# Patient Record
Sex: Female | Born: 1960
Health system: Southern US, Community
[De-identification: ages and names within clinical notes are randomized; demographics above are authoritative.]

## PROBLEM LIST (undated history)

## (undated) DIAGNOSIS — H348122 Central retinal vein occlusion, left eye, stable: Principal | ICD-10-CM

## (undated) DIAGNOSIS — H9190 Unspecified hearing loss, unspecified ear: Secondary | ICD-10-CM

## (undated) DIAGNOSIS — T8859XA Other complications of anesthesia, initial encounter: Secondary | ICD-10-CM

## (undated) DIAGNOSIS — E785 Hyperlipidemia, unspecified: Secondary | ICD-10-CM

## (undated) DIAGNOSIS — H409 Unspecified glaucoma: Secondary | ICD-10-CM

## (undated) DIAGNOSIS — I10 Essential (primary) hypertension: Secondary | ICD-10-CM

## (undated) DIAGNOSIS — J45909 Unspecified asthma, uncomplicated: Secondary | ICD-10-CM

## (undated) DIAGNOSIS — D329 Benign neoplasm of meninges, unspecified: Principal | ICD-10-CM

## (undated) HISTORY — DX: Essential (primary) hypertension: I10

## (undated) HISTORY — PX: MANDIBLE SURGERY: SHX707

## (undated) HISTORY — DX: Unspecified glaucoma: H40.9

## (undated) HISTORY — DX: Benign neoplasm of meninges, unspecified: D32.9

## (undated) HISTORY — DX: Central retinal vein occlusion, left eye, stable: H34.8122

## (undated) HISTORY — DX: Hyperlipidemia, unspecified: E78.5

---

## 1998-03-09 ENCOUNTER — Other Ambulatory Visit: Admission: RE | Admit: 1998-03-09 | Discharge: 1998-03-09 | Payer: Self-pay | Admitting: Obstetrics & Gynecology

## 1998-03-16 ENCOUNTER — Ambulatory Visit (HOSPITAL_COMMUNITY): Admission: RE | Admit: 1998-03-16 | Discharge: 1998-03-16 | Payer: Self-pay | Admitting: Obstetrics & Gynecology

## 1999-03-28 ENCOUNTER — Other Ambulatory Visit: Admission: RE | Admit: 1999-03-28 | Discharge: 1999-03-28 | Payer: Self-pay | Admitting: Obstetrics & Gynecology

## 1999-04-05 ENCOUNTER — Encounter: Payer: Self-pay | Admitting: Obstetrics & Gynecology

## 1999-04-05 ENCOUNTER — Ambulatory Visit (HOSPITAL_COMMUNITY): Admission: RE | Admit: 1999-04-05 | Discharge: 1999-04-05 | Payer: Self-pay | Admitting: Obstetrics & Gynecology

## 1999-04-10 ENCOUNTER — Encounter: Payer: Self-pay | Admitting: Obstetrics & Gynecology

## 1999-04-10 ENCOUNTER — Ambulatory Visit (HOSPITAL_COMMUNITY): Admission: RE | Admit: 1999-04-10 | Discharge: 1999-04-10 | Payer: Self-pay | Admitting: Obstetrics & Gynecology

## 1999-04-17 ENCOUNTER — Ambulatory Visit (HOSPITAL_COMMUNITY): Admission: RE | Admit: 1999-04-17 | Discharge: 1999-04-17 | Payer: Self-pay | Admitting: General Surgery

## 1999-04-17 ENCOUNTER — Encounter (INDEPENDENT_AMBULATORY_CARE_PROVIDER_SITE_OTHER): Payer: Self-pay

## 2000-04-08 ENCOUNTER — Encounter: Payer: Self-pay | Admitting: Obstetrics & Gynecology

## 2000-04-08 ENCOUNTER — Ambulatory Visit (HOSPITAL_COMMUNITY): Admission: RE | Admit: 2000-04-08 | Discharge: 2000-04-08 | Payer: Self-pay | Admitting: Obstetrics & Gynecology

## 2000-04-21 ENCOUNTER — Other Ambulatory Visit: Admission: RE | Admit: 2000-04-21 | Discharge: 2000-04-21 | Payer: Self-pay | Admitting: Obstetrics & Gynecology

## 2001-04-17 ENCOUNTER — Ambulatory Visit (HOSPITAL_COMMUNITY): Admission: RE | Admit: 2001-04-17 | Discharge: 2001-04-17 | Payer: Self-pay | Admitting: Obstetrics & Gynecology

## 2001-04-17 ENCOUNTER — Encounter: Payer: Self-pay | Admitting: Obstetrics & Gynecology

## 2001-04-23 ENCOUNTER — Other Ambulatory Visit: Admission: RE | Admit: 2001-04-23 | Discharge: 2001-04-23 | Payer: Self-pay | Admitting: Obstetrics & Gynecology

## 2002-01-06 ENCOUNTER — Other Ambulatory Visit: Admission: RE | Admit: 2002-01-06 | Discharge: 2002-01-06 | Payer: Self-pay | Admitting: Obstetrics & Gynecology

## 2002-04-19 ENCOUNTER — Ambulatory Visit (HOSPITAL_COMMUNITY): Admission: RE | Admit: 2002-04-19 | Discharge: 2002-04-19 | Payer: Self-pay | Admitting: Obstetrics & Gynecology

## 2002-04-19 ENCOUNTER — Encounter: Payer: Self-pay | Admitting: Obstetrics & Gynecology

## 2002-04-28 ENCOUNTER — Encounter (INDEPENDENT_AMBULATORY_CARE_PROVIDER_SITE_OTHER): Payer: Self-pay | Admitting: Specialist

## 2002-04-28 ENCOUNTER — Ambulatory Visit (HOSPITAL_COMMUNITY): Admission: RE | Admit: 2002-04-28 | Discharge: 2002-04-28 | Payer: Self-pay | Admitting: Obstetrics & Gynecology

## 2003-01-13 ENCOUNTER — Other Ambulatory Visit: Admission: RE | Admit: 2003-01-13 | Discharge: 2003-01-13 | Payer: Self-pay | Admitting: Obstetrics & Gynecology

## 2003-05-06 ENCOUNTER — Ambulatory Visit (HOSPITAL_COMMUNITY): Admission: RE | Admit: 2003-05-06 | Discharge: 2003-05-06 | Payer: Self-pay | Admitting: Obstetrics & Gynecology

## 2003-05-12 ENCOUNTER — Encounter: Admission: RE | Admit: 2003-05-12 | Discharge: 2003-05-12 | Payer: Self-pay | Admitting: Obstetrics & Gynecology

## 2004-02-07 ENCOUNTER — Other Ambulatory Visit: Admission: RE | Admit: 2004-02-07 | Discharge: 2004-02-07 | Payer: Self-pay | Admitting: Obstetrics & Gynecology

## 2004-05-16 ENCOUNTER — Ambulatory Visit (HOSPITAL_COMMUNITY): Admission: RE | Admit: 2004-05-16 | Discharge: 2004-05-16 | Payer: Self-pay | Admitting: Obstetrics & Gynecology

## 2004-11-13 ENCOUNTER — Ambulatory Visit (HOSPITAL_COMMUNITY): Admission: RE | Admit: 2004-11-13 | Discharge: 2004-11-13 | Payer: Self-pay | Admitting: Obstetrics & Gynecology

## 2004-11-13 ENCOUNTER — Encounter (INDEPENDENT_AMBULATORY_CARE_PROVIDER_SITE_OTHER): Payer: Self-pay | Admitting: *Deleted

## 2005-02-14 ENCOUNTER — Other Ambulatory Visit: Admission: RE | Admit: 2005-02-14 | Discharge: 2005-02-14 | Payer: Self-pay | Admitting: Obstetrics & Gynecology

## 2005-05-20 ENCOUNTER — Ambulatory Visit (HOSPITAL_COMMUNITY): Admission: RE | Admit: 2005-05-20 | Discharge: 2005-05-20 | Payer: Self-pay | Admitting: Obstetrics & Gynecology

## 2006-06-04 ENCOUNTER — Ambulatory Visit (HOSPITAL_COMMUNITY): Admission: RE | Admit: 2006-06-04 | Discharge: 2006-06-04 | Payer: Self-pay | Admitting: Obstetrics & Gynecology

## 2006-06-16 ENCOUNTER — Encounter: Admission: RE | Admit: 2006-06-16 | Discharge: 2006-06-16 | Payer: Self-pay | Admitting: Obstetrics & Gynecology

## 2007-07-06 ENCOUNTER — Ambulatory Visit (HOSPITAL_COMMUNITY): Admission: RE | Admit: 2007-07-06 | Discharge: 2007-07-06 | Payer: Self-pay | Admitting: Obstetrics & Gynecology

## 2008-07-07 ENCOUNTER — Ambulatory Visit (HOSPITAL_COMMUNITY): Admission: RE | Admit: 2008-07-07 | Discharge: 2008-07-07 | Payer: Self-pay | Admitting: Obstetrics & Gynecology

## 2008-09-20 ENCOUNTER — Encounter: Admission: RE | Admit: 2008-09-20 | Discharge: 2008-09-20 | Payer: Self-pay | Admitting: Otolaryngology

## 2008-09-21 ENCOUNTER — Encounter: Admission: RE | Admit: 2008-09-21 | Discharge: 2008-09-21 | Payer: Self-pay | Admitting: Otolaryngology

## 2009-07-10 ENCOUNTER — Ambulatory Visit (HOSPITAL_COMMUNITY): Admission: RE | Admit: 2009-07-10 | Discharge: 2009-07-10 | Payer: Self-pay | Admitting: Obstetrics & Gynecology

## 2010-06-03 HISTORY — PX: HEMANGIOMA EXCISION: SHX1734

## 2010-06-21 ENCOUNTER — Other Ambulatory Visit (HOSPITAL_COMMUNITY): Payer: Self-pay | Admitting: Obstetrics & Gynecology

## 2010-06-21 DIAGNOSIS — Z0000 Encounter for general adult medical examination without abnormal findings: Secondary | ICD-10-CM

## 2010-06-24 ENCOUNTER — Encounter: Payer: Self-pay | Admitting: Obstetrics & Gynecology

## 2010-06-24 ENCOUNTER — Encounter: Payer: Self-pay | Admitting: Otolaryngology

## 2010-07-05 ENCOUNTER — Ambulatory Visit (HOSPITAL_COMMUNITY): Admission: RE | Admit: 2010-07-05 | Payer: Self-pay | Source: Home / Self Care | Admitting: Obstetrics & Gynecology

## 2010-07-05 ENCOUNTER — Ambulatory Visit (HOSPITAL_COMMUNITY)
Admission: RE | Admit: 2010-07-05 | Discharge: 2010-07-05 | Disposition: A | Discharge status: Home / Self Care | Payer: BC Managed Care – PPO | Source: Ambulatory Visit | Attending: Obstetrics & Gynecology | Admitting: Obstetrics & Gynecology

## 2010-07-05 DIAGNOSIS — Z1231 Encounter for screening mammogram for malignant neoplasm of breast: Secondary | ICD-10-CM | POA: Insufficient documentation

## 2010-07-05 DIAGNOSIS — Z0000 Encounter for general adult medical examination without abnormal findings: Secondary | ICD-10-CM

## 2010-10-19 NOTE — Op Note (Signed)
Tara Kennedy, Tara Kennedy                        ACCOUNT NO.:  [id number]   MEDICAL RECORD NO.:  [id number]                   PATIENT TYPE:  AMB   LOCATION:  SDC                                  FACILITY:  [facility]   PHYSICIAN:  Freddy Finner, M.D.                DATE OF BIRTH:  August 07, 1960   DATE OF PROCEDURE:  04/28/2002  DATE OF DISCHARGE:                                 OPERATIVE REPORT   PREOPERATIVE DIAGNOSES:  1. Menorrhagia.  2. Endometrial polyp.   POSTOPERATIVE DIAGNOSES:  1. Menorrhagia.  2. Endometrial polyp.   SECONDARY DIAGNOSIS:  Known uterine leiomyoma.   OPERATIVE PROCEDURES:  1. Hysteroscopy.  2. Dilatation and curettage.   ANESTHESIA:  General.   INTRAOPERATIVE COMPLICATIONS:  None.   ESTIMATED INTRAOPERATIVE BLOOD LOSS:  Less than 20 cc.   INTRAOPERATIVE SORBITOL DEFICIT:  100 cc.   INDICATIONS:  The patient is a 50 year old who presented for routine GYN  examination which was remarkable for a history of increasing menorrhagia.  Subsequent sonohysterogram showed an endometrial polyp.  The uterus itself  was slightly enlarged and there were small fibroids.  She is admitted now  for hysteroscopy, D&AC, and removal of polyp.   DESCRIPTION OF PROCEDURE:  She was admitted on the morning of surgery and  brought to the operating room.  She was placed under adequate general  anesthesia and placed in the dorsolithotomy position using the [employer]  system.  Betadine prep of the perineum and vagina was carried out in the  usual fashion.  Sterile drapes were applied.  A bivalve speculum was  introduced and the cervix was visualized and grasped with a sharp single-  tooth tenaculum.  Some difficulty was initially encountered identifying the  internal os.  The uterus sounded to 10 cm.  The os was identified and  carefully dilated to 23 with Pratts.  The hysteroscope could still not be  passed because of the acute angle of the internal os with the endometriosis.  Progressive dilatation was carried out to 27.  The hysteroscope was passed.  The polyp was visualized, photographed, and then removed with gentle sharp  curettage.  Exploration with Garen Grams forceps.  Reinspection after  removal revealed adequate resection of the polyp.  The 12.5 degree ACMI  hysteroscope was used.  Three percent Sorbitol was used as the distending  median.  Deficit as noted above.  On completion of the procedure, the patient was awaken and taken to the  recovery room in good condition.  She was given Darvocet for postoperative  pain.  She is to report to the office in about 10 days for postoperative  follow-up.  She is to call for bleeding, fever, and severe pain.  She is to  avoid vaginal entry for five to seven days.  Freddy Finner, M.D.    WRN/MEDQ  D:  04/28/2002  T:  04/28/2002  Job:  [id number]

## 2010-10-19 NOTE — Op Note (Signed)
[name], Tara Kennedy            ACCOUNT NO.:  [id number]   MEDICAL RECORD NO.:  [id number]          PATIENT TYPE:  AMB   LOCATION:  SDC                           [facility]:  [facility]   PHYSICIAN:  Freddy Finner, M.D.   DATE OF BIRTH:  1960-10-13   DATE OF PROCEDURE:  11/13/2004  DATE OF DISCHARGE:                                 OPERATIVE REPORT   PREOPERATIVE DIAGNOSES:  1.  Menorrhagia.  2.  Endometrial thickening.  3.  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  4.  Intramural fibroids.   POSTOPERATIVE DIAGNOSES:  1.  Fibroids.  2.  Endometrial polyp.  3.  Uterine enlargement.   OPERATIVE PROCEDURE:  Hysterectomy, D&C with resection of polyp, NovaSure  endometrial ablation.   ANESTHESIA:  Managed intravenous sedation and paracervical block.   ESTIMATED INTRAOPERATIVE BLOOD LOSS:  Minimal.   INTRAOPERATIVE COMPLICATIONS:  None.   INTRAOPERATIVE LACTATED RINGERS DEFICIT:  10 cc.   The patient is a 50 year old with very heavy menses who had sonohysterogram  in the office showing endometrial thickening and/or endometrial synechia.  She is now to have intramural leiomyoma. She is now admitted for  hysteroscopy D&C and NovaSure.   She is admitted on the morning of surgery. She was given a perioperative  antibiotic. She was brought to the operating room and placed under adequate  intravenous sedation and placed in the dorsal lithotomy position using the  [employer] stirrup system. Betadine prep was carried out in the usual fashion.  Sterile drapes were applied. Bivalved speculum was introduced into the  vagina and the cervix visualized, grasped on the anterior lip with a single-  toothed tenaculum. Paracervical block was placed using a total of 10 cc of  1% Xylocaine. Injections were made at 4 and 8 o'clock in the vaginal  fornices. Cervix sounded to 4 cm. Uterus sounded to 10 cm. Cervix was  progressively dilated to 23 with [facility] dilators. A 12.5 degree ACMI  hysteroscope was introduced  using lactated ringers as distending medium.  There were clots in the endometrial cavity which eventually cleared. There  was what appeared to be a polyp on the lower uterine segment posteriorly and  to the patient's right. Gentle thorough curettage was carried out and  exploration with polyp forceps for endometrial sampling and resection of the  polyp. This was confirmed by repeat inspection with the hysteroscope. The  NovaSure device was then applied under standard procedure, tested successful  with the carbon dioxide test, and the endometrial ablation was accomplished.  Reinspection revealed adequate endometrial ablation throughout the  endometrial cavity. Photographs were made pre and post NovaSure procedure.  The instruments were removed.   The patient was taken to the recovery room in good condition. She did  receive 30 mg of Toradol IV and 30 mg of Toradol IM on completion of the  NovaSure procedure. She will be given routine outpatient surgical  instructions. She is for followup in the office in approximately two weeks.  She is take Darvocet as needed for postoperative pain unrelieved by the  Toradol or other outpatient medications.       WRN/MEDQ  D:  11/13/2004  T:  11/13/2004  Job:  [id number]

## 2011-05-10 ENCOUNTER — Other Ambulatory Visit: Payer: Self-pay | Admitting: Otolaryngology

## 2011-05-10 DIAGNOSIS — D333 Benign neoplasm of cranial nerves: Secondary | ICD-10-CM

## 2011-05-17 ENCOUNTER — Ambulatory Visit
Admission: RE | Admit: 2011-05-17 | Discharge: 2011-05-17 | Disposition: A | Discharge status: Home / Self Care | Payer: BC Managed Care – PPO | Source: Ambulatory Visit | Attending: Otolaryngology | Admitting: Otolaryngology

## 2011-05-17 DIAGNOSIS — D333 Benign neoplasm of cranial nerves: Secondary | ICD-10-CM

## 2011-05-17 MED ORDER — GADOBENATE DIMEGLUMINE 529 MG/ML IV SOLN
12.0000 mL | Freq: Once | INTRAVENOUS | Status: AC | PRN
  Administered 2011-05-17: 12 mL via INTRAVENOUS

## 2011-07-22 ENCOUNTER — Other Ambulatory Visit (HOSPITAL_COMMUNITY): Payer: Self-pay | Admitting: Obstetrics & Gynecology

## 2011-07-22 DIAGNOSIS — Z1231 Encounter for screening mammogram for malignant neoplasm of breast: Secondary | ICD-10-CM

## 2011-08-15 ENCOUNTER — Ambulatory Visit (HOSPITAL_COMMUNITY)
Admission: RE | Admit: 2011-08-15 | Discharge: 2011-08-15 | Disposition: A | Discharge status: Home / Self Care | Payer: BC Managed Care – PPO | Source: Ambulatory Visit | Attending: Obstetrics & Gynecology | Admitting: Obstetrics & Gynecology

## 2011-08-15 DIAGNOSIS — Z1231 Encounter for screening mammogram for malignant neoplasm of breast: Secondary | ICD-10-CM

## 2011-11-20 ENCOUNTER — Other Ambulatory Visit: Payer: Self-pay | Admitting: *Deleted

## 2011-11-20 DIAGNOSIS — D329 Benign neoplasm of meninges, unspecified: Secondary | ICD-10-CM

## 2011-12-10 ENCOUNTER — Ambulatory Visit
Admission: RE | Admit: 2011-12-10 | Discharge: 2011-12-10 | Disposition: A | Discharge status: Home / Self Care | Payer: BC Managed Care – PPO | Source: Ambulatory Visit | Attending: Radiology | Admitting: Radiology

## 2011-12-10 DIAGNOSIS — D329 Benign neoplasm of meninges, unspecified: Secondary | ICD-10-CM

## 2011-12-10 MED ORDER — GADOBENATE DIMEGLUMINE 529 MG/ML IV SOLN
12.0000 mL | Freq: Once | INTRAVENOUS | Status: AC | PRN
  Administered 2011-12-10: 12 mL via INTRAVENOUS

## 2012-01-01 DIAGNOSIS — G245 Blepharospasm: Secondary | ICD-10-CM | POA: Insufficient documentation

## 2012-01-01 DIAGNOSIS — G510 Bell's palsy: Secondary | ICD-10-CM | POA: Insufficient documentation

## 2012-02-13 ENCOUNTER — Telehealth: Payer: Self-pay | Admitting: Oncology

## 2012-02-13 NOTE — Telephone Encounter (Signed)
S/W pt in re NP appt 10/2 @ 1:30 w/Dr. Cyndie Chime Referring Dr. Barbaraann Barthel Dx-MTHFR mutation NP packet mailed.

## 2012-02-14 ENCOUNTER — Telehealth: Payer: Self-pay | Admitting: Oncology

## 2012-02-14 NOTE — Telephone Encounter (Signed)
C/D on 9/13 for appt 10/02

## 2012-02-20 ENCOUNTER — Encounter: Payer: Self-pay | Admitting: Oncology

## 2012-02-20 ENCOUNTER — Other Ambulatory Visit: Payer: Self-pay | Admitting: Oncology

## 2012-02-20 DIAGNOSIS — H348122 Central retinal vein occlusion, left eye, stable: Secondary | ICD-10-CM

## 2012-02-20 HISTORY — DX: Central retinal vein occlusion, left eye, stable: H34.8122

## 2012-02-21 ENCOUNTER — Telehealth: Payer: Self-pay | Admitting: Oncology

## 2012-02-21 NOTE — Telephone Encounter (Signed)
Per 9/19 pof have pt come in for NP lb on 9/30. Moved FC/lb appts to 9/30 and NP appt is now 10/2 @ 2pm. lmonvm for pt but then called her cell and s/w her re changes.

## 2012-02-27 ENCOUNTER — Telehealth: Payer: Self-pay | Admitting: Oncology

## 2012-02-27 NOTE — Telephone Encounter (Signed)
Returned pt's call re wanting to move 9/30 NP lab to 10/1. lmonvm for pt to call me back re keeping 9/30 appt as I am unsure if we will have all lbs back for 10/2 NP appt if we move the lb to 10/1. Tiffany aware.

## 2012-03-02 ENCOUNTER — Ambulatory Visit: Payer: BC Managed Care – PPO

## 2012-03-02 ENCOUNTER — Other Ambulatory Visit: Payer: BC Managed Care – PPO | Admitting: Lab

## 2012-03-03 ENCOUNTER — Other Ambulatory Visit (HOSPITAL_BASED_OUTPATIENT_CLINIC_OR_DEPARTMENT_OTHER): Payer: BC Managed Care – PPO | Admitting: Lab

## 2012-03-03 DIAGNOSIS — H349 Unspecified retinal vascular occlusion: Secondary | ICD-10-CM

## 2012-03-03 DIAGNOSIS — H348122 Central retinal vein occlusion, left eye, stable: Secondary | ICD-10-CM

## 2012-03-03 LAB — CBC & DIFF AND RETIC
BASO%: 0.4 % (ref 0.0–2.0)
Basophils Absolute: 0 10*3/uL (ref 0.0–0.1)
EOS%: 1.1 % (ref 0.0–7.0)
Eosinophils Absolute: 0.1 10*3/uL (ref 0.0–0.5)
HCT: 40.7 % (ref 34.8–46.6)
HGB: 13.5 g/dL (ref 11.6–15.9)
Immature Retic Fract: 2.4 % (ref 1.60–10.00)
LYMPH%: 8.4 % — ABNORMAL LOW (ref 14.0–49.7)
MCH: 30.1 pg (ref 25.1–34.0)
MCHC: 33.2 g/dL (ref 31.5–36.0)
MCV: 90.8 fL (ref 79.5–101.0)
MONO#: 0.9 10*3/uL (ref 0.1–0.9)
MONO%: 11.1 % (ref 0.0–14.0)
NEUT#: 6.6 10*3/uL — ABNORMAL HIGH (ref 1.5–6.5)
NEUT%: 79 % — ABNORMAL HIGH (ref 38.4–76.8)
Platelets: 212 10*3/uL (ref 145–400)
RBC: 4.48 10*6/uL (ref 3.70–5.45)
RDW: 14.4 % (ref 11.2–14.5)
Retic %: 1.58 % (ref 0.70–2.10)
Retic Ct Abs: 70.78 10*3/uL (ref 33.70–90.70)
WBC: 8.4 10*3/uL (ref 3.9–10.3)
lymph#: 0.7 10*3/uL — ABNORMAL LOW (ref 0.9–3.3)

## 2012-03-03 LAB — CHCC SMEAR

## 2012-03-03 LAB — MORPHOLOGY: PLT EST: ADEQUATE

## 2012-03-04 ENCOUNTER — Ambulatory Visit: Payer: BC Managed Care – PPO

## 2012-03-04 ENCOUNTER — Ambulatory Visit (HOSPITAL_BASED_OUTPATIENT_CLINIC_OR_DEPARTMENT_OTHER): Payer: BC Managed Care – PPO | Admitting: Oncology

## 2012-03-04 ENCOUNTER — Encounter: Payer: Self-pay | Admitting: Oncology

## 2012-03-04 ENCOUNTER — Other Ambulatory Visit: Payer: BC Managed Care – PPO | Admitting: Lab

## 2012-03-04 VITALS — BP 122/79 | HR 73 | Temp 97.1°F | Resp 20 | Ht 65.0 in | Wt 141.0 lb

## 2012-03-04 DIAGNOSIS — H349 Unspecified retinal vascular occlusion: Secondary | ICD-10-CM

## 2012-03-04 DIAGNOSIS — D329 Benign neoplasm of meninges, unspecified: Secondary | ICD-10-CM

## 2012-03-04 DIAGNOSIS — I10 Essential (primary) hypertension: Secondary | ICD-10-CM

## 2012-03-04 DIAGNOSIS — E785 Hyperlipidemia, unspecified: Secondary | ICD-10-CM

## 2012-03-04 DIAGNOSIS — Z832 Family history of diseases of the blood and blood-forming organs and certain disorders involving the immune mechanism: Secondary | ICD-10-CM

## 2012-03-04 DIAGNOSIS — Z8249 Family history of ischemic heart disease and other diseases of the circulatory system: Secondary | ICD-10-CM

## 2012-03-04 DIAGNOSIS — H409 Unspecified glaucoma: Secondary | ICD-10-CM

## 2012-03-04 HISTORY — DX: Hyperlipidemia, unspecified: E78.5

## 2012-03-04 HISTORY — DX: Unspecified glaucoma: H40.9

## 2012-03-04 HISTORY — DX: Essential (primary) hypertension: I10

## 2012-03-04 HISTORY — DX: Benign neoplasm of meninges, unspecified: D32.9

## 2012-03-04 NOTE — Progress Notes (Signed)
New Patient Hematology-Oncology Evaluation   Tara Kennedy [id number] 1960/07/22 51 y.o. 03/04/2012  CC: Dr. Beverley Fiedler; Dr. Lottie Dawson ophthalmology; Dr. Elvina Sidle - neurosurgery at [facility]; Dr. Amada Jupiter - radiation oncology at [facility]   Reason for referral:  Personal History of left branch retinal vein occlusion with strong family history of clotting   HPI:  New patient evaluation for this 50 year old woman originally from [place] who developed progressive right facial swelling back in 2010. She was initially referred to Dr. Lovey Newcomer, ear, nose and throat surgeon, and it was felt that this was a sinus problem. He took one look at her and sent her for a CT scan of the brain which showed a meningioma. She was evaluated by neurosurgery at [facility]. After an initial period of observation, tumor began to grow rapidly and she required a major surgical procedure on 12/31/2008. Tumor had infiltrated into local tissues. Due to residual disease she was referred for radiation therapy given in November and December of 2010. Sometime after that, she was evaluated by a retinal specialist Dr. Freddi Starr at [facility] and told that she had a branch retinal vein occlusion. There was increased intraocular pressure consistent with glaucoma. She has used atenolol and Xalatan eyedrops since that time. Additional risk factors for retinal vein occlusion include hypertension and hyperlipidemia. In addition to the above, she tells that she has a pituitary tumor and some small thyroid nodules. (MEN syndrome?)  There is a strong family history of early cardiac disease and clotting problems. Her sister who is currently age 24 and recently diagnosed with breast cancer which has progressed to bone, had a DVT in her arm following hand surgery. Hypercoagulation evaluation revealed she had a positive lupus-type anticoagulant, and she tested positive for the MTHFR gene polymorphism. She was negative for the factor V Leiden gene  mutation and the prothrombin gene mutation.  A brother now 77 years old has a history of thrombosis details not known to the patient. He already has a pacemaker and a implanted defibrillator. Another brother age 16 has had an MI and also has an implanted defibrillator. One brother age 62 and a sister 46 without clotting problems. Her father died at age 25 of an MI. A paternal grandfather died at age 82 of an MI. Her mother died 2 years ago at age 76 of complications of stomach cancer. She was on Coumadin for unknown reasons. She has 4 children, 3 boys age 57, 32, and 34 who are healthy and a 80 year old daughter who was found to have a pituitary tumor.  Subsequent to the 2010 surgery she had to have an additional reconstructive surgery procedure on her right jaw due to severe trismus. No perioperative complications. She had a bunionectomy with no problems.  She is a nonsmoker. No diabetes. She is not obese. She does take Lipitor and Pravachol for hyperlipidemia.   PMH: Past Medical History  Diagnosis Date  . Retinal vein thrombosis, left 02/20/2012  . Meningioma 03/04/2012    Dx 7/10; major resection at [facility], Dr Elvina Sidle then RT  Dr Amada Jupiter  . Glaucoma 03/04/2012  . HTN (hypertension), benign 03/04/2012  . Hyperlipidemia 03/04/2012   she has asthma.   past surgical history:  See above  Allergies: No Known Allergies to medications. She is allergic to cats.  Medications: Lipitor 20 mg daily, Prinivil 10 mg daily, Pravachol 80 mg daily, timolol drops 0.5% one drop both eyes daily, Xalatan 0.005% drops one drop left eye at bedtime  Social History:  Married. 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 Nonsmoker. She does drink wine occasionally. 4 children. 37 year old daughter found to have a pituitary tumor/prolactinoma?   Family History: See above  Review of Systems: Constitutional symptoms: No constitutional symptoms HEENT: No sore throat Respiratory: No  cough or dyspnea Cardiovascular:  No chest pain or palpitations Gastrointestinal ROS: No change in bowel habit Genito-Urinary ROS: Previous hysterectomy without oophorectomy. Previous endometrial ablation procedure prior to the hysterectomy to control menorrhagia Hematological and Lymphatic: Musculoskeletal: No muscle or bone pain. Neurologic: No headache. No blurred vision. Dermatologic: No rash Remaining ROS negative.  Physical Exam: Blood pressure 122/79, pulse 73, temperature 97.1 F (36.2 C), resp. rate 20, height 5\' 5"  (1.651 m), weight 141 lb (63.957 kg). Wt Readings from Last 3 Encounters:  03/04/12 141 lb (63.957 kg)    General appearance: Thin Caucasian woman Head: Right Facial dystocia status post extensive surgery; she still has some trismus I cannot open her mouth completely. Neck: Full range of motion Lymph nodes: No adenopathy Breasts: Not examined Lungs: Clear to auscultation resonant to percussion Heart: Regular rhythm no murmur Abdominal: Soft, nontender, no mass, no organomegaly GU: Not examined Extremities: No edema, no calf tenderness Neurologic: Mental status intact, cranial nerves grossly normal, PERRLA, increased optic cup to disc ratio bilaterally, retinal vessels appear grossly normal with simple ophthalmoscope exam, motor strength is 5 over 5, reflexes 1+ symmetric, sensation intact to vibration over the fingertips by tuning fork exam Skin: No rash or ecchymosis    Lab Results: Lab Results  Component Value Date   WBC 8.4 03/03/2012   HGB 13.5 03/03/2012   HCT 40.7 03/03/2012   MCV 90.8 03/03/2012   PLT 212 03/03/2012     Chemistry   No results found for this basename: NA, K, CL, CO2, BUN, CREATININE, GLU   No results found for this basename: CALCIUM, ALKPHOS, AST, ALT, BILITOT    Lupus-type anticoagulant is not detected. Anticardiolipin antibodies and antibodies to beta 2 glycoprotein 1 are pending   Review of peripheral blood film:  Normal red  cells, white cells, and platelets   Radiological Studies: A recent MRI of the brain done at [facility] on 12/10/2011 shows persistent, extensive, infiltrative tumor involving the right skull base, infra-temporal fossa, right masticator space, right clivus, cavernous sinus, and right sella but unchanged compared with a 05/17/2011 study. Major. intracranial vascular flow stable. No major vessel thrombosis or cavernous sinus thrombosis is noted    Impression and Plan: 51 year old woman  incidentally found to have a left branch retinal vein occlusion following extensive right intracranial surgery and radiation for an aggressive meningioma. She has glaucoma and it is not clear whether this antedated the surgery or not but it is certainly a major risk factor for retinal vein occlusion in addition to other risk factors including her previously untreated hypertension. It is likely that the major intracranial surgery was related to the branch retinal vein occlusion.  Despite her family history, there is no clinical or laboratory suspicion that she has a congenital coagulopathy. With respect to the MTHFR. gene polymorphism, we have learned over the years that this is a surrogate marker for early thrombosis particularly on the arterial side but even when elevated plasma homocystine levels are returned to normal with B complex vitamins, the risk does not go down. Overall, polymorphisms  in this gene is only a minor risk factor for thrombosis and we don't recommend even screening for them anymore as  part of a general hypercoagulation evaluation. Therefore, I did not check her MHTFR. status. She has enough reasons to explain why she had a retinal vein occlusion .  I have not scheduled a followup visit at this time.       Levert Feinstein, MD 03/04/2012, 5:20 PM

## 2012-03-05 LAB — BETA-2 GLYCOPROTEIN ANTIBODIES
Beta-2 Glyco I IgG: 0 G Units (ref <20)
Beta-2-Glycoprotein I IgA: 5 A Units (ref <20)
Beta-2-Glycoprotein I IgM: 18 M Units (ref <20)

## 2012-03-05 LAB — LUPUS ANTICOAGULANT PANEL
DRVVT: 36.1 secs (ref <42.9)
Lupus Anticoagulant: NOT DETECTED
PTT Lupus Anticoagulant: 29.8 secs (ref 28.0–43.0)

## 2012-03-05 LAB — CARDIOLIPIN ANTIBODIES, IGG, IGM, IGA
Anticardiolipin IgA: 7 APL U/mL (ref <22)
Anticardiolipin IgG: 10 GPL U/mL (ref <23)
Anticardiolipin IgM: 5 MPL U/mL (ref <11)

## 2012-03-05 LAB — IMMUNOFIXATION ELECTROPHORESIS
IgA: 357 mg/dL (ref 69–380)
IgG (Immunoglobin G), Serum: 1220 mg/dL (ref 690–1700)
IgM, Serum: 161 mg/dL (ref 52–322)
Total Protein, Serum Electrophoresis: 6.8 g/dL (ref 6.0–8.3)

## 2012-03-05 LAB — SEDIMENTATION RATE: Sed Rate: 1 mm/hr (ref 0–22)

## 2012-03-06 ENCOUNTER — Telehealth: Payer: Self-pay | Admitting: *Deleted

## 2012-03-06 NOTE — Telephone Encounter (Signed)
Called patient and let her know there was no elevation of antiphospholipid antibodies.  Will route labs to [employer] copied for MR to fax to Dr. Barbaraann Barthel,  Unable to route in [place]

## 2012-03-06 NOTE — Telephone Encounter (Addendum)
Message copied by Orbie Hurst on Fri Mar 06, 2012  3:31 PM ------      Message from: Levert Feinstein      Created: Wed Mar 04, 2012  6:35 PM       Call pt:  No elevation of antiphospholipid antibodies; please route labs including negative lupus anticoagulant to Dr Barton [facility] copied for MR to fax to Dr. Barbaraann Barthel.  Unable to route in epic.

## 2012-03-06 NOTE — Telephone Encounter (Signed)
Fax lab to Dr. Barbaraann Barthel office for [employer]

## 2012-03-25 ENCOUNTER — Other Ambulatory Visit: Payer: Self-pay | Admitting: Dermatology

## 2012-07-15 ENCOUNTER — Other Ambulatory Visit (HOSPITAL_COMMUNITY): Payer: Self-pay | Admitting: Obstetrics & Gynecology

## 2012-07-15 DIAGNOSIS — Z1231 Encounter for screening mammogram for malignant neoplasm of breast: Secondary | ICD-10-CM

## 2012-08-17 ENCOUNTER — Ambulatory Visit (HOSPITAL_COMMUNITY)
Admission: RE | Admit: 2012-08-17 | Discharge: 2012-08-17 | Disposition: A | Discharge status: Home / Self Care | Payer: BC Managed Care – PPO | Source: Ambulatory Visit | Attending: Obstetrics & Gynecology | Admitting: Obstetrics & Gynecology

## 2012-08-17 DIAGNOSIS — Z1231 Encounter for screening mammogram for malignant neoplasm of breast: Secondary | ICD-10-CM

## 2012-09-30 ENCOUNTER — Other Ambulatory Visit: Payer: Self-pay | Admitting: *Deleted

## 2012-09-30 DIAGNOSIS — D320 Benign neoplasm of cerebral meninges: Secondary | ICD-10-CM

## 2012-12-01 ENCOUNTER — Ambulatory Visit
Admission: RE | Admit: 2012-12-01 | Discharge: 2012-12-01 | Disposition: A | Discharge status: Home / Self Care | Payer: BC Managed Care – PPO | Source: Ambulatory Visit | Attending: *Deleted | Admitting: *Deleted

## 2012-12-01 DIAGNOSIS — D320 Benign neoplasm of cerebral meninges: Secondary | ICD-10-CM

## 2012-12-01 MED ORDER — GADOBENATE DIMEGLUMINE 529 MG/ML IV SOLN
12.0000 mL | Freq: Once | INTRAVENOUS | Status: AC | PRN
  Administered 2012-12-01: 12 mL via INTRAVENOUS

## 2013-02-26 DIAGNOSIS — H401131 Primary open-angle glaucoma, bilateral, mild stage: Secondary | ICD-10-CM | POA: Insufficient documentation

## 2013-03-31 ENCOUNTER — Other Ambulatory Visit: Payer: Self-pay | Admitting: Dermatology

## 2013-07-19 ENCOUNTER — Other Ambulatory Visit (HOSPITAL_COMMUNITY): Payer: Self-pay | Admitting: Obstetrics & Gynecology

## 2013-07-19 DIAGNOSIS — Z1231 Encounter for screening mammogram for malignant neoplasm of breast: Secondary | ICD-10-CM

## 2013-08-18 ENCOUNTER — Ambulatory Visit (HOSPITAL_COMMUNITY)
Admission: RE | Admit: 2013-08-18 | Discharge: 2013-08-18 | Disposition: A | Discharge status: Home / Self Care | Payer: BC Managed Care – HMO | Source: Ambulatory Visit | Attending: Obstetrics & Gynecology | Admitting: Obstetrics & Gynecology

## 2013-08-18 DIAGNOSIS — Z1231 Encounter for screening mammogram for malignant neoplasm of breast: Secondary | ICD-10-CM

## 2013-09-28 ENCOUNTER — Other Ambulatory Visit: Payer: Self-pay | Admitting: Radiology

## 2013-09-28 DIAGNOSIS — D320 Benign neoplasm of cerebral meninges: Secondary | ICD-10-CM

## 2013-11-15 ENCOUNTER — Other Ambulatory Visit: Payer: BC Managed Care – PPO

## 2013-11-15 ENCOUNTER — Ambulatory Visit
Admission: RE | Admit: 2013-11-15 | Discharge: 2013-11-15 | Disposition: A | Discharge status: Home / Self Care | Payer: BC Managed Care – PPO | Source: Ambulatory Visit | Attending: Radiology | Admitting: Radiology

## 2013-11-15 DIAGNOSIS — D320 Benign neoplasm of cerebral meninges: Secondary | ICD-10-CM

## 2013-11-15 MED ORDER — GADOBENATE DIMEGLUMINE 529 MG/ML IV SOLN
12.0000 mL | Freq: Once | INTRAVENOUS | Status: AC | PRN
  Administered 2013-11-15: 12 mL via INTRAVENOUS

## 2014-02-28 ENCOUNTER — Other Ambulatory Visit: Payer: Self-pay | Admitting: Obstetrics & Gynecology

## 2014-03-01 LAB — CYTOLOGY - PAP

## 2014-08-23 ENCOUNTER — Other Ambulatory Visit (HOSPITAL_COMMUNITY): Payer: Self-pay | Admitting: Obstetrics & Gynecology

## 2014-08-23 DIAGNOSIS — Z1231 Encounter for screening mammogram for malignant neoplasm of breast: Secondary | ICD-10-CM

## 2014-11-07 ENCOUNTER — Ambulatory Visit (HOSPITAL_COMMUNITY)
Admission: RE | Admit: 2014-11-07 | Discharge: 2014-11-07 | Disposition: A | Discharge status: Home / Self Care | Payer: BLUE CROSS/BLUE SHIELD | Source: Ambulatory Visit | Attending: Obstetrics & Gynecology | Admitting: Obstetrics & Gynecology

## 2014-11-07 ENCOUNTER — Other Ambulatory Visit: Payer: Self-pay | Admitting: Radiology

## 2014-11-07 DIAGNOSIS — Z1231 Encounter for screening mammogram for malignant neoplasm of breast: Secondary | ICD-10-CM

## 2014-11-07 DIAGNOSIS — D329 Benign neoplasm of meninges, unspecified: Secondary | ICD-10-CM

## 2014-11-29 ENCOUNTER — Ambulatory Visit
Admission: RE | Admit: 2014-11-29 | Discharge: 2014-11-29 | Disposition: A | Discharge status: Home / Self Care | Payer: BLUE CROSS/BLUE SHIELD | Source: Ambulatory Visit | Attending: Radiology | Admitting: Radiology

## 2014-11-29 DIAGNOSIS — D329 Benign neoplasm of meninges, unspecified: Secondary | ICD-10-CM

## 2014-11-29 MED ORDER — GADOBENATE DIMEGLUMINE 529 MG/ML IV SOLN
12.0000 mL | Freq: Once | INTRAVENOUS | Status: AC | PRN
  Administered 2014-11-29: 12 mL via INTRAVENOUS

## 2014-12-09 DIAGNOSIS — D320 Benign neoplasm of cerebral meninges: Secondary | ICD-10-CM | POA: Insufficient documentation

## 2015-03-06 ENCOUNTER — Other Ambulatory Visit: Payer: Self-pay | Admitting: Obstetrics & Gynecology

## 2015-03-07 LAB — CYTOLOGY - PAP

## 2015-03-15 ENCOUNTER — Telehealth: Payer: Self-pay | Admitting: Oncology

## 2015-03-15 NOTE — Telephone Encounter (Signed)
Completed records request on 03/15/15. Placed with Ms Darryll Capers for patient pick up. Lt mess for pt regarding paperwork complete and ready for pick up

## 2015-04-25 DIAGNOSIS — H35033 Hypertensive retinopathy, bilateral: Secondary | ICD-10-CM | POA: Insufficient documentation

## 2015-04-25 DIAGNOSIS — H2513 Age-related nuclear cataract, bilateral: Secondary | ICD-10-CM | POA: Insufficient documentation

## 2015-05-01 ENCOUNTER — Other Ambulatory Visit: Payer: Self-pay | Admitting: Ophthalmology

## 2015-05-01 DIAGNOSIS — I6521 Occlusion and stenosis of right carotid artery: Secondary | ICD-10-CM

## 2015-05-10 ENCOUNTER — Ambulatory Visit
Admission: RE | Admit: 2015-05-10 | Discharge: 2015-05-10 | Disposition: A | Discharge status: Home / Self Care | Payer: BLUE CROSS/BLUE SHIELD | Source: Ambulatory Visit | Attending: Ophthalmology | Admitting: Ophthalmology

## 2015-05-10 DIAGNOSIS — I6521 Occlusion and stenosis of right carotid artery: Secondary | ICD-10-CM

## 2015-09-05 DIAGNOSIS — H401131 Primary open-angle glaucoma, bilateral, mild stage: Secondary | ICD-10-CM | POA: Diagnosis not present

## 2015-09-05 DIAGNOSIS — H2513 Age-related nuclear cataract, bilateral: Secondary | ICD-10-CM | POA: Diagnosis not present

## 2015-09-05 DIAGNOSIS — H35033 Hypertensive retinopathy, bilateral: Secondary | ICD-10-CM | POA: Diagnosis not present

## 2015-09-29 DIAGNOSIS — H401131 Primary open-angle glaucoma, bilateral, mild stage: Secondary | ICD-10-CM | POA: Diagnosis not present

## 2015-10-10 ENCOUNTER — Other Ambulatory Visit: Payer: Self-pay

## 2015-10-10 DIAGNOSIS — Z1231 Encounter for screening mammogram for malignant neoplasm of breast: Secondary | ICD-10-CM

## 2015-10-26 ENCOUNTER — Other Ambulatory Visit: Payer: Self-pay | Admitting: Radiology

## 2015-10-26 DIAGNOSIS — D329 Benign neoplasm of meninges, unspecified: Secondary | ICD-10-CM

## 2015-11-08 ENCOUNTER — Ambulatory Visit
Admission: RE | Admit: 2015-11-08 | Discharge: 2015-11-08 | Disposition: A | Discharge status: Home / Self Care | Payer: BLUE CROSS/BLUE SHIELD | Source: Ambulatory Visit

## 2015-11-08 ENCOUNTER — Ambulatory Visit
Admission: RE | Admit: 2015-11-08 | Discharge: 2015-11-08 | Disposition: A | Discharge status: Home / Self Care | Payer: BLUE CROSS/BLUE SHIELD | Source: Ambulatory Visit | Attending: Radiology | Admitting: Radiology

## 2015-11-08 DIAGNOSIS — Z1231 Encounter for screening mammogram for malignant neoplasm of breast: Secondary | ICD-10-CM | POA: Diagnosis not present

## 2015-11-08 DIAGNOSIS — D329 Benign neoplasm of meninges, unspecified: Secondary | ICD-10-CM | POA: Diagnosis not present

## 2015-11-08 MED ORDER — GADOBENATE DIMEGLUMINE 529 MG/ML IV SOLN
12.0000 mL | Freq: Once | INTRAVENOUS | Status: AC | PRN
  Administered 2015-11-08: 12 mL via INTRAVENOUS

## 2015-12-15 DIAGNOSIS — D320 Benign neoplasm of cerebral meninges: Secondary | ICD-10-CM | POA: Diagnosis not present

## 2015-12-20 DIAGNOSIS — H401131 Primary open-angle glaucoma, bilateral, mild stage: Secondary | ICD-10-CM | POA: Diagnosis not present

## 2016-02-06 DIAGNOSIS — H35033 Hypertensive retinopathy, bilateral: Secondary | ICD-10-CM | POA: Diagnosis not present

## 2016-02-06 DIAGNOSIS — H401131 Primary open-angle glaucoma, bilateral, mild stage: Secondary | ICD-10-CM | POA: Diagnosis not present

## 2016-02-07 DIAGNOSIS — E7800 Pure hypercholesterolemia, unspecified: Secondary | ICD-10-CM | POA: Diagnosis not present

## 2016-02-07 DIAGNOSIS — R252 Cramp and spasm: Secondary | ICD-10-CM | POA: Diagnosis not present

## 2016-02-07 DIAGNOSIS — I10 Essential (primary) hypertension: Secondary | ICD-10-CM | POA: Diagnosis not present

## 2016-02-08 DIAGNOSIS — E7800 Pure hypercholesterolemia, unspecified: Secondary | ICD-10-CM | POA: Diagnosis not present

## 2016-02-13 DIAGNOSIS — D2261 Melanocytic nevi of right upper limb, including shoulder: Secondary | ICD-10-CM | POA: Diagnosis not present

## 2016-02-13 DIAGNOSIS — D225 Melanocytic nevi of trunk: Secondary | ICD-10-CM | POA: Diagnosis not present

## 2016-02-13 DIAGNOSIS — Z85828 Personal history of other malignant neoplasm of skin: Secondary | ICD-10-CM | POA: Diagnosis not present

## 2016-02-13 DIAGNOSIS — I788 Other diseases of capillaries: Secondary | ICD-10-CM | POA: Diagnosis not present

## 2016-03-13 DIAGNOSIS — Z6823 Body mass index (BMI) 23.0-23.9, adult: Secondary | ICD-10-CM | POA: Diagnosis not present

## 2016-03-13 DIAGNOSIS — Z01419 Encounter for gynecological examination (general) (routine) without abnormal findings: Secondary | ICD-10-CM | POA: Diagnosis not present

## 2016-03-13 DIAGNOSIS — N390 Urinary tract infection, site not specified: Secondary | ICD-10-CM | POA: Diagnosis not present

## 2016-03-26 DIAGNOSIS — R6884 Jaw pain: Secondary | ICD-10-CM | POA: Diagnosis not present

## 2016-03-26 DIAGNOSIS — H6982 Other specified disorders of Eustachian tube, left ear: Secondary | ICD-10-CM | POA: Diagnosis not present

## 2016-03-26 DIAGNOSIS — K135 Oral submucous fibrosis: Secondary | ICD-10-CM | POA: Diagnosis not present

## 2016-03-26 DIAGNOSIS — H903 Sensorineural hearing loss, bilateral: Secondary | ICD-10-CM | POA: Diagnosis not present

## 2016-06-11 DIAGNOSIS — H903 Sensorineural hearing loss, bilateral: Secondary | ICD-10-CM | POA: Diagnosis not present

## 2016-08-06 DIAGNOSIS — E7800 Pure hypercholesterolemia, unspecified: Secondary | ICD-10-CM | POA: Diagnosis not present

## 2016-08-06 DIAGNOSIS — I10 Essential (primary) hypertension: Secondary | ICD-10-CM | POA: Diagnosis not present

## 2016-08-08 DIAGNOSIS — I10 Essential (primary) hypertension: Secondary | ICD-10-CM | POA: Diagnosis not present

## 2016-08-08 DIAGNOSIS — E7800 Pure hypercholesterolemia, unspecified: Secondary | ICD-10-CM | POA: Diagnosis not present

## 2016-08-26 DIAGNOSIS — Z8669 Personal history of other diseases of the nervous system and sense organs: Secondary | ICD-10-CM | POA: Diagnosis not present

## 2016-08-26 DIAGNOSIS — H35033 Hypertensive retinopathy, bilateral: Secondary | ICD-10-CM | POA: Diagnosis not present

## 2016-08-26 DIAGNOSIS — H401131 Primary open-angle glaucoma, bilateral, mild stage: Secondary | ICD-10-CM | POA: Diagnosis not present

## 2016-09-18 DIAGNOSIS — H6982 Other specified disorders of Eustachian tube, left ear: Secondary | ICD-10-CM | POA: Diagnosis not present

## 2016-09-18 DIAGNOSIS — H6531 Chronic mucoid otitis media, right ear: Secondary | ICD-10-CM | POA: Diagnosis not present

## 2016-09-18 DIAGNOSIS — H903 Sensorineural hearing loss, bilateral: Secondary | ICD-10-CM | POA: Diagnosis not present

## 2016-09-18 DIAGNOSIS — M26611 Adhesions and ankylosis of right temporomandibular joint: Secondary | ICD-10-CM | POA: Diagnosis not present

## 2016-10-18 ENCOUNTER — Other Ambulatory Visit: Payer: Self-pay | Admitting: Obstetrics & Gynecology

## 2016-10-18 DIAGNOSIS — Z1231 Encounter for screening mammogram for malignant neoplasm of breast: Secondary | ICD-10-CM

## 2016-10-21 DIAGNOSIS — H401131 Primary open-angle glaucoma, bilateral, mild stage: Secondary | ICD-10-CM | POA: Diagnosis not present

## 2016-11-11 ENCOUNTER — Ambulatory Visit
Admission: RE | Admit: 2016-11-11 | Discharge: 2016-11-11 | Disposition: A | Discharge status: Home / Self Care | Payer: BLUE CROSS/BLUE SHIELD | Source: Ambulatory Visit | Attending: Obstetrics & Gynecology | Admitting: Obstetrics & Gynecology

## 2016-11-11 DIAGNOSIS — Z1231 Encounter for screening mammogram for malignant neoplasm of breast: Secondary | ICD-10-CM

## 2016-12-09 ENCOUNTER — Other Ambulatory Visit: Payer: Self-pay | Admitting: Radiology

## 2016-12-09 DIAGNOSIS — D329 Benign neoplasm of meninges, unspecified: Secondary | ICD-10-CM

## 2016-12-13 ENCOUNTER — Ambulatory Visit
Admission: RE | Admit: 2016-12-13 | Discharge: 2016-12-13 | Disposition: A | Discharge status: Home / Self Care | Payer: BLUE CROSS/BLUE SHIELD | Source: Ambulatory Visit | Attending: Radiology | Admitting: Radiology

## 2016-12-13 DIAGNOSIS — D329 Benign neoplasm of meninges, unspecified: Secondary | ICD-10-CM

## 2016-12-13 DIAGNOSIS — D320 Benign neoplasm of cerebral meninges: Secondary | ICD-10-CM | POA: Diagnosis not present

## 2016-12-17 DIAGNOSIS — D320 Benign neoplasm of cerebral meninges: Secondary | ICD-10-CM | POA: Diagnosis not present

## 2017-02-10 DIAGNOSIS — Z86018 Personal history of other benign neoplasm: Secondary | ICD-10-CM | POA: Diagnosis not present

## 2017-02-10 DIAGNOSIS — E7800 Pure hypercholesterolemia, unspecified: Secondary | ICD-10-CM | POA: Diagnosis not present

## 2017-02-10 DIAGNOSIS — I10 Essential (primary) hypertension: Secondary | ICD-10-CM | POA: Diagnosis not present

## 2017-02-12 DIAGNOSIS — I10 Essential (primary) hypertension: Secondary | ICD-10-CM | POA: Diagnosis not present

## 2017-02-12 DIAGNOSIS — E7800 Pure hypercholesterolemia, unspecified: Secondary | ICD-10-CM | POA: Diagnosis not present

## 2017-03-04 DIAGNOSIS — H401131 Primary open-angle glaucoma, bilateral, mild stage: Secondary | ICD-10-CM | POA: Diagnosis not present

## 2017-03-04 DIAGNOSIS — H35033 Hypertensive retinopathy, bilateral: Secondary | ICD-10-CM | POA: Diagnosis not present

## 2017-03-04 DIAGNOSIS — H2513 Age-related nuclear cataract, bilateral: Secondary | ICD-10-CM | POA: Diagnosis not present

## 2017-03-18 DIAGNOSIS — Z01419 Encounter for gynecological examination (general) (routine) without abnormal findings: Secondary | ICD-10-CM | POA: Diagnosis not present

## 2017-03-18 DIAGNOSIS — Z6823 Body mass index (BMI) 23.0-23.9, adult: Secondary | ICD-10-CM | POA: Diagnosis not present

## 2017-03-19 DIAGNOSIS — H401131 Primary open-angle glaucoma, bilateral, mild stage: Secondary | ICD-10-CM | POA: Diagnosis not present

## 2017-03-24 DIAGNOSIS — G510 Bell's palsy: Secondary | ICD-10-CM | POA: Diagnosis not present

## 2017-03-24 DIAGNOSIS — D320 Benign neoplasm of cerebral meninges: Secondary | ICD-10-CM | POA: Diagnosis not present

## 2017-03-24 DIAGNOSIS — R252 Cramp and spasm: Secondary | ICD-10-CM | POA: Diagnosis not present

## 2017-03-24 DIAGNOSIS — H90A21 Sensorineural hearing loss, unilateral, right ear, with restricted hearing on the contralateral side: Secondary | ICD-10-CM | POA: Insufficient documentation

## 2017-03-24 DIAGNOSIS — H905 Unspecified sensorineural hearing loss: Secondary | ICD-10-CM | POA: Diagnosis not present

## 2017-03-26 DIAGNOSIS — D352 Benign neoplasm of pituitary gland: Secondary | ICD-10-CM | POA: Diagnosis not present

## 2017-04-17 DIAGNOSIS — M26611 Adhesions and ankylosis of right temporomandibular joint: Secondary | ICD-10-CM | POA: Diagnosis not present

## 2017-04-17 DIAGNOSIS — D352 Benign neoplasm of pituitary gland: Secondary | ICD-10-CM | POA: Diagnosis not present

## 2017-04-17 DIAGNOSIS — H6531 Chronic mucoid otitis media, right ear: Secondary | ICD-10-CM | POA: Diagnosis not present

## 2017-04-17 DIAGNOSIS — H903 Sensorineural hearing loss, bilateral: Secondary | ICD-10-CM | POA: Diagnosis not present

## 2017-04-18 DIAGNOSIS — D2262 Melanocytic nevi of left upper limb, including shoulder: Secondary | ICD-10-CM | POA: Diagnosis not present

## 2017-04-18 DIAGNOSIS — D2221 Melanocytic nevi of right ear and external auricular canal: Secondary | ICD-10-CM | POA: Diagnosis not present

## 2017-04-18 DIAGNOSIS — Z85828 Personal history of other malignant neoplasm of skin: Secondary | ICD-10-CM | POA: Diagnosis not present

## 2017-04-18 DIAGNOSIS — D225 Melanocytic nevi of trunk: Secondary | ICD-10-CM | POA: Diagnosis not present

## 2017-05-07 DIAGNOSIS — Z923 Personal history of irradiation: Secondary | ICD-10-CM | POA: Diagnosis not present

## 2017-05-07 DIAGNOSIS — R252 Cramp and spasm: Secondary | ICD-10-CM | POA: Diagnosis not present

## 2017-05-20 DIAGNOSIS — D329 Benign neoplasm of meninges, unspecified: Secondary | ICD-10-CM | POA: Diagnosis not present

## 2017-05-20 DIAGNOSIS — Z923 Personal history of irradiation: Secondary | ICD-10-CM | POA: Diagnosis not present

## 2017-05-20 DIAGNOSIS — D352 Benign neoplasm of pituitary gland: Secondary | ICD-10-CM | POA: Diagnosis not present

## 2017-05-20 DIAGNOSIS — G510 Bell's palsy: Secondary | ICD-10-CM | POA: Diagnosis not present

## 2017-05-20 DIAGNOSIS — D320 Benign neoplasm of cerebral meninges: Secondary | ICD-10-CM | POA: Diagnosis not present

## 2017-05-20 DIAGNOSIS — G245 Blepharospasm: Secondary | ICD-10-CM | POA: Diagnosis not present

## 2017-05-29 DIAGNOSIS — H401131 Primary open-angle glaucoma, bilateral, mild stage: Secondary | ICD-10-CM | POA: Diagnosis not present

## 2017-06-18 DIAGNOSIS — D352 Benign neoplasm of pituitary gland: Secondary | ICD-10-CM | POA: Diagnosis not present

## 2017-06-18 DIAGNOSIS — H66002 Acute suppurative otitis media without spontaneous rupture of ear drum, left ear: Secondary | ICD-10-CM | POA: Diagnosis not present

## 2017-06-18 DIAGNOSIS — K135 Oral submucous fibrosis: Secondary | ICD-10-CM | POA: Diagnosis not present

## 2017-06-18 DIAGNOSIS — R6884 Jaw pain: Secondary | ICD-10-CM | POA: Diagnosis not present

## 2017-06-30 DIAGNOSIS — M26611 Adhesions and ankylosis of right temporomandibular joint: Secondary | ICD-10-CM | POA: Diagnosis not present

## 2017-06-30 DIAGNOSIS — H903 Sensorineural hearing loss, bilateral: Secondary | ICD-10-CM | POA: Diagnosis not present

## 2017-06-30 DIAGNOSIS — D352 Benign neoplasm of pituitary gland: Secondary | ICD-10-CM | POA: Diagnosis not present

## 2017-06-30 DIAGNOSIS — H6531 Chronic mucoid otitis media, right ear: Secondary | ICD-10-CM | POA: Diagnosis not present

## 2017-08-08 DIAGNOSIS — E7800 Pure hypercholesterolemia, unspecified: Secondary | ICD-10-CM | POA: Diagnosis not present

## 2017-08-12 DIAGNOSIS — E7800 Pure hypercholesterolemia, unspecified: Secondary | ICD-10-CM | POA: Diagnosis not present

## 2017-08-12 DIAGNOSIS — I10 Essential (primary) hypertension: Secondary | ICD-10-CM | POA: Diagnosis not present

## 2017-08-12 DIAGNOSIS — Z86018 Personal history of other benign neoplasm: Secondary | ICD-10-CM | POA: Diagnosis not present

## 2017-08-12 DIAGNOSIS — Z0000 Encounter for general adult medical examination without abnormal findings: Secondary | ICD-10-CM | POA: Diagnosis not present

## 2017-08-27 DIAGNOSIS — H401131 Primary open-angle glaucoma, bilateral, mild stage: Secondary | ICD-10-CM | POA: Diagnosis not present

## 2017-09-10 DIAGNOSIS — H401131 Primary open-angle glaucoma, bilateral, mild stage: Secondary | ICD-10-CM | POA: Diagnosis not present

## 2017-12-01 DIAGNOSIS — H401131 Primary open-angle glaucoma, bilateral, mild stage: Secondary | ICD-10-CM | POA: Diagnosis not present

## 2017-12-23 ENCOUNTER — Other Ambulatory Visit: Payer: Self-pay | Admitting: Obstetrics & Gynecology

## 2017-12-23 DIAGNOSIS — Z1231 Encounter for screening mammogram for malignant neoplasm of breast: Secondary | ICD-10-CM

## 2018-01-13 ENCOUNTER — Ambulatory Visit
Admission: RE | Admit: 2018-01-13 | Discharge: 2018-01-13 | Disposition: A | Discharge status: Home / Self Care | Payer: BLUE CROSS/BLUE SHIELD | Source: Ambulatory Visit | Attending: Obstetrics & Gynecology | Admitting: Obstetrics & Gynecology

## 2018-01-13 DIAGNOSIS — Z1231 Encounter for screening mammogram for malignant neoplasm of breast: Secondary | ICD-10-CM | POA: Diagnosis not present

## 2018-01-20 DIAGNOSIS — M26611 Adhesions and ankylosis of right temporomandibular joint: Secondary | ICD-10-CM | POA: Diagnosis not present

## 2018-01-20 DIAGNOSIS — H6531 Chronic mucoid otitis media, right ear: Secondary | ICD-10-CM | POA: Diagnosis not present

## 2018-01-20 DIAGNOSIS — H903 Sensorineural hearing loss, bilateral: Secondary | ICD-10-CM | POA: Diagnosis not present

## 2018-01-20 DIAGNOSIS — H66002 Acute suppurative otitis media without spontaneous rupture of ear drum, left ear: Secondary | ICD-10-CM | POA: Diagnosis not present

## 2018-01-27 DIAGNOSIS — Z1211 Encounter for screening for malignant neoplasm of colon: Secondary | ICD-10-CM | POA: Diagnosis not present

## 2018-01-29 DIAGNOSIS — H6531 Chronic mucoid otitis media, right ear: Secondary | ICD-10-CM | POA: Diagnosis not present

## 2018-01-29 DIAGNOSIS — M26611 Adhesions and ankylosis of right temporomandibular joint: Secondary | ICD-10-CM | POA: Diagnosis not present

## 2018-01-29 DIAGNOSIS — H903 Sensorineural hearing loss, bilateral: Secondary | ICD-10-CM | POA: Diagnosis not present

## 2018-01-29 DIAGNOSIS — H6612 Chronic tubotympanic suppurative otitis media, left ear: Secondary | ICD-10-CM | POA: Diagnosis not present

## 2018-03-19 ENCOUNTER — Ambulatory Visit (INDEPENDENT_AMBULATORY_CARE_PROVIDER_SITE_OTHER): Payer: BLUE CROSS/BLUE SHIELD | Admitting: Podiatry

## 2018-03-19 ENCOUNTER — Encounter: Payer: Self-pay | Admitting: Podiatry

## 2018-03-19 ENCOUNTER — Ambulatory Visit (INDEPENDENT_AMBULATORY_CARE_PROVIDER_SITE_OTHER): Payer: BLUE CROSS/BLUE SHIELD

## 2018-03-19 VITALS — BP 132/79 | HR 75 | Resp 16

## 2018-03-19 DIAGNOSIS — M779 Enthesopathy, unspecified: Secondary | ICD-10-CM

## 2018-03-19 DIAGNOSIS — M2042 Other hammer toe(s) (acquired), left foot: Secondary | ICD-10-CM | POA: Diagnosis not present

## 2018-03-19 DIAGNOSIS — Q828 Other specified congenital malformations of skin: Secondary | ICD-10-CM | POA: Diagnosis not present

## 2018-03-19 DIAGNOSIS — M778 Other enthesopathies, not elsewhere classified: Secondary | ICD-10-CM

## 2018-03-19 DIAGNOSIS — S82002A Unspecified fracture of left patella, initial encounter for closed fracture: Secondary | ICD-10-CM | POA: Diagnosis not present

## 2018-03-19 NOTE — Progress Notes (Signed)
  Subjective:  Patient ID: Tara Kennedy, female    DOB: January 15, 1961,  MRN: [id number] HPI Chief Complaint  Patient presents with  . Toe Pain    4th toe left (interdigital) - tender, callused area x several months, no treatment  . New Patient (Initial Visit)    57 y.o. female presents with the above complaint.   ROS: Denies fever chills nausea vomiting muscle aches pains calf pain back pain chest pain shortness of breath.  Past Medical History:  Diagnosis Date  . Glaucoma 03/04/2012  . HTN (hypertension), benign 03/04/2012  . Hyperlipidemia 03/04/2012  . Meningioma ([place]) 03/04/2012   Dx 7/10; major resection at [facility], Dr Ernie Avena then RT  Dr Leonel Ramsay  . Retinal vein thrombosis, left 02/20/2012   No past surgical history on file.  Current Outpatient Medications:  .  atorvastatin (LIPITOR) 20 MG tablet, Take 20 mg by mouth daily. , Disp: , Rfl:  .  dorzolamide-timolol (COSOPT) 22.3-6.8 MG/ML ophthalmic solution, INSTILL 1 DROP INTO BOTH EYES TWICE A DAY, Disp: , Rfl: 11 .  lisinopril (PRINIVIL,ZESTRIL) 10 MG tablet, Take 10 mg by mouth daily. , Disp: , Rfl:   Allergies  Allergen Reactions  . Latex Other (See Comments) and Swelling    Gloves - lips swell with dentist latex gloves Other Reaction: swollen lips from dentist    Review of Systems Objective:   Vitals:   03/19/18 1501  BP: 132/79  Pulse: 75  Resp: 16    General: Well developed, nourished, in no acute distress, alert and oriented x3   Dermatological: Skin is warm, dry and supple bilateral. Nails x 10 are well maintained; remaining integument appears unremarkable at this time. There are no open sores, no preulcerative lesions, no rash or signs of infection present.  Vascular: Dorsalis Pedis artery and Posterior Tibial artery pedal pulses are 2/4 bilateral with immedate capillary fill time. Pedal hair growth present. No varicosities and no lower extremity edema present bilateral.   Neruologic: Grossly intact  via light touch bilateral. Vibratory intact via tuning fork bilateral. Protective threshold with Semmes Wienstein monofilament intact to all pedal sites bilateral. Patellar and Achilles deep tendon reflexes 2+ bilateral. No Babinski or clonus noted bilateral.   Musculoskeletal: No gross boney pedal deformities bilateral. No pain, crepitus, or limitation noted with foot and ankle range of motion bilateral. Muscular strength 5/5 in all groups tested bilateral.  Gait: Unassisted, Nonantalgic.    Radiographs:  Demonstrate surgical internal fixation hallux varus.  Diastases between the third and fourth toes.  No acute injuries.  F2  Assessment & Plan:   Assessment: Reactive hyperkeratosis lateral aspect of the PIPJ fourth digit left.  Plan: Placed silicone pad.     Max T. [place], [place]

## 2018-03-25 ENCOUNTER — Other Ambulatory Visit: Payer: Self-pay | Admitting: Gastroenterology

## 2018-03-25 DIAGNOSIS — Z01818 Encounter for other preprocedural examination: Secondary | ICD-10-CM | POA: Diagnosis not present

## 2018-03-26 DIAGNOSIS — H903 Sensorineural hearing loss, bilateral: Secondary | ICD-10-CM | POA: Diagnosis not present

## 2018-03-26 DIAGNOSIS — M26611 Adhesions and ankylosis of right temporomandibular joint: Secondary | ICD-10-CM | POA: Diagnosis not present

## 2018-03-26 DIAGNOSIS — K135 Oral submucous fibrosis: Secondary | ICD-10-CM | POA: Diagnosis not present

## 2018-03-26 DIAGNOSIS — H6531 Chronic mucoid otitis media, right ear: Secondary | ICD-10-CM | POA: Diagnosis not present

## 2018-04-01 DIAGNOSIS — Z01419 Encounter for gynecological examination (general) (routine) without abnormal findings: Secondary | ICD-10-CM | POA: Diagnosis not present

## 2018-04-01 DIAGNOSIS — Z6824 Body mass index (BMI) 24.0-24.9, adult: Secondary | ICD-10-CM | POA: Diagnosis not present

## 2018-04-01 DIAGNOSIS — Z13820 Encounter for screening for osteoporosis: Secondary | ICD-10-CM | POA: Diagnosis not present

## 2018-04-03 DIAGNOSIS — H35033 Hypertensive retinopathy, bilateral: Secondary | ICD-10-CM | POA: Diagnosis not present

## 2018-04-03 DIAGNOSIS — H401131 Primary open-angle glaucoma, bilateral, mild stage: Secondary | ICD-10-CM | POA: Diagnosis not present

## 2018-04-03 DIAGNOSIS — Z8669 Personal history of other diseases of the nervous system and sense organs: Secondary | ICD-10-CM | POA: Diagnosis not present

## 2018-04-03 DIAGNOSIS — H2513 Age-related nuclear cataract, bilateral: Secondary | ICD-10-CM | POA: Diagnosis not present

## 2018-04-03 DIAGNOSIS — Z23 Encounter for immunization: Secondary | ICD-10-CM | POA: Diagnosis not present

## 2018-04-16 DIAGNOSIS — S82002D Unspecified fracture of left patella, subsequent encounter for closed fracture with routine healing: Secondary | ICD-10-CM | POA: Diagnosis not present

## 2018-05-06 ENCOUNTER — Encounter (HOSPITAL_COMMUNITY): Payer: Self-pay | Admitting: *Deleted

## 2018-05-07 ENCOUNTER — Ambulatory Visit (HOSPITAL_COMMUNITY)
Admission: RE | Admit: 2018-05-07 | Discharge: 2018-05-07 | Disposition: A | Discharge status: Home / Self Care | Payer: BLUE CROSS/BLUE SHIELD | Source: Ambulatory Visit | Attending: Gastroenterology | Admitting: Gastroenterology

## 2018-05-07 ENCOUNTER — Ambulatory Visit (HOSPITAL_COMMUNITY): Payer: BLUE CROSS/BLUE SHIELD | Admitting: Anesthesiology

## 2018-05-07 ENCOUNTER — Encounter (HOSPITAL_COMMUNITY)
Admission: RE | Disposition: A | Discharge status: Home / Self Care | Payer: Self-pay | Source: Ambulatory Visit | Attending: Gastroenterology

## 2018-05-07 ENCOUNTER — Encounter (HOSPITAL_COMMUNITY): Payer: Self-pay | Admitting: Emergency Medicine

## 2018-05-07 ENCOUNTER — Other Ambulatory Visit: Payer: Self-pay

## 2018-05-07 DIAGNOSIS — Z1211 Encounter for screening for malignant neoplasm of colon: Secondary | ICD-10-CM | POA: Diagnosis not present

## 2018-05-07 DIAGNOSIS — E785 Hyperlipidemia, unspecified: Secondary | ICD-10-CM | POA: Diagnosis not present

## 2018-05-07 DIAGNOSIS — I10 Essential (primary) hypertension: Secondary | ICD-10-CM | POA: Insufficient documentation

## 2018-05-07 DIAGNOSIS — Z79899 Other long term (current) drug therapy: Secondary | ICD-10-CM | POA: Insufficient documentation

## 2018-05-07 HISTORY — PX: COLONOSCOPY WITH PROPOFOL: SHX5780

## 2018-05-07 SURGERY — COLONOSCOPY WITH PROPOFOL
Anesthesia: Monitor Anesthesia Care

## 2018-05-07 MED ORDER — PROPOFOL 500 MG/50ML IV EMUL
INTRAVENOUS | Status: DC | PRN
  Administered 2018-05-07: 25 ug/kg/min via INTRAVENOUS

## 2018-05-07 MED ORDER — PROPOFOL 10 MG/ML IV BOLUS
INTRAVENOUS | Status: DC | PRN
  Administered 2018-05-07 (×10): 10 mg via INTRAVENOUS
  Administered 2018-05-07 (×2): 20 mg via INTRAVENOUS
  Administered 2018-05-07 (×3): 10 mg via INTRAVENOUS

## 2018-05-07 MED ORDER — LIDOCAINE 2% (20 MG/ML) 5 ML SYRINGE
INTRAMUSCULAR | Status: DC | PRN
  Administered 2018-05-07: 60 mg via INTRAVENOUS

## 2018-05-07 MED ORDER — LACTATED RINGERS IV SOLN
INTRAVENOUS | Status: DC
  Administered 2018-05-07: 13:00:00 via INTRAVENOUS

## 2018-05-07 MED ORDER — SODIUM CHLORIDE 0.9 % IV SOLN: INTRAVENOUS | Status: DC

## 2018-05-07 MED ORDER — ETOMIDATE 2 MG/ML IV SOLN
INTRAVENOUS | Status: DC | PRN
  Administered 2018-05-07: 2 mg via INTRAVENOUS
  Administered 2018-05-07: 4 mg via INTRAVENOUS
  Administered 2018-05-07 (×4): 2 mg via INTRAVENOUS
  Administered 2018-05-07: 4 mg via INTRAVENOUS

## 2018-05-07 MED ORDER — SOD CITRATE-CITRIC ACID 500-334 MG/5ML PO SOLN
30.0000 mL | ORAL | Status: AC
  Administered 2018-05-07: 30 mL via ORAL
  Filled 2018-05-07: qty 30

## 2018-05-07 MED ORDER — PROPOFOL 10 MG/ML IV BOLUS
INTRAVENOUS | Status: AC
  Filled 2018-05-07: qty 60

## 2018-05-07 SURGICAL SUPPLY — 21 items

## 2018-05-07 NOTE — Op Note (Signed)
[facility] Patient Name: Tara Kennedy Procedure Date: 05/07/2018 MRN: [id number] Attending MD: Ronald Lobo , MD Date of Birth: 11-19-1960 CSN: [id number] Age: 57 Admit Type: Inpatient Procedure:                Colonoscopy Indications:              Screening for colorectal malignant neoplasm, This                            is the patient's first colonoscopy. It was done at                            the hospital because of decreased jaw mobility                            related to multiple previous jaw operations. Providers:                Ronald Lobo, MD, Zenon Mayo, RN, Alan Mulder, Technician, Adair Laundry, CRNA Referring MD:              Medicines:                Monitored Anesthesia Care Complications:            No immediate complications. Estimated Blood Loss:     Estimated blood loss: none. Procedure:                Pre-Anesthesia Assessment:                           - Prior to the procedure, a History and Physical                            was performed, and patient medications and                            allergies were reviewed. The patient's tolerance of                            previous anesthesia was also reviewed. The risks                            and benefits of the procedure and the sedation                            options and risks were discussed with the patient.                            All questions were answered, and informed consent                            was obtained. Prior Anticoagulants: The patient has  taken no previous anticoagulant or antiplatelet                            agents. ASA Grade Assessment: III - A patient with                            severe systemic disease. After reviewing the risks                            and benefits, the patient was deemed in                            satisfactory condition to undergo the procedure.                After obtaining informed consent, the colonoscope                            was passed under direct vision. Throughout the                            procedure, the patient's blood pressure, pulse, and                            oxygen saturations were monitored continuously. The                            PCF-H190DL ([id number] Olympus peds colonoscope was                            introduced through the anus and advanced to the the                            cecum, identified by the ileocecal valve and                            transillumination of the right lower quadrant. The                            appendiceal orifice was never visualized. The                            colonoscopy was technically difficult and complex                            due to poor bowel prep and significant looping.                            Successful completion of the procedure was                            attempted by using manual pressure and                            straightening and shortening the  scope to obtain                            bowel loop reduction. The patient tolerated the                            procedure well. The quality of the bowel                            preparation was inadequate. Despite approximately 2                            L of irrigation and suctioning, there remained                            moderately large amounts of solid stool, as well as                            some puddles of "pea soup" liquid stool, which                            could not be suctioned. It is felt that significant                            lesions might potentially have been missed due to                            the inadequate prep. Scope In: 3:08:13 PM Scope Out: 3:47:26 PM Scope Withdrawal Time: 0 hours 13 minutes 1 second  Total Procedure Duration: 0 hours 39 minutes 13 seconds  Findings:      The perianal and digital rectal examinations were normal.       The colon (entire examined portion) appeared GROSSLY normal.      There is no endoscopic evidence of diverticula, inflammation, mass or       polyps in the entire colon within the significant limitations of       visualization as described above.      A large amount of liquid and solid stool was found in the entire colon,       precluding visualization.      The retroflexed view of the distal rectum and anal verge was normal and       showed no anal or rectal abnormalities. Impression:               - Preparation of the colon was inadequate.                           - The entire examined colon is GROSSLY normal;                            nowever, although no abnormalities were seen, the                            poor prep may have obscured abnormalities and  prevented their detection.                           - Stool in the entire examined colon as described                            above.                           - The distal rectum and anal verge are normal on                            retroflexion view.                           - No specimens collected. Moderate Sedation:      This patient was sedated with monitored anesthesia care, not moderate       sedation. Recommendation:           - Return to my office in 1 month to discuss options                            for management: repeat colonoscopy with more                            aggressive prep, vs. stool testing, vs. observation. Procedure Code(s):        --- Professional ---                           [phone], Colonoscopy, flexible; diagnostic, including                            collection of specimen(s) by brushing or washing,                            when performed (separate procedure) Diagnosis Code(s):        --- Professional ---                           Z12.11, Encounter for screening for malignant                            neoplasm of colon CPT copyright 2018 American Medical  Association. All rights reserved. The codes documented in this report are preliminary and upon coder review may  be revised to meet current compliance requirements. Ronald Lobo, MD 05/07/2018 4:01:14 PM This report has been signed electronically. Number of Addenda: 0

## 2018-05-07 NOTE — Anesthesia Postprocedure Evaluation (Signed)
Anesthesia Post Note  Patient: Tara Kennedy  Procedure(s) Performed: COLONOSCOPY WITH PROPOFOL (N/A )     Patient location during evaluation: Endoscopy Anesthesia Type: MAC Level of consciousness: awake and alert, oriented and patient cooperative Pain management: pain level controlled Vital Signs Assessment: post-procedure vital signs reviewed and stable Respiratory status: spontaneous breathing, nonlabored ventilation and respiratory function stable Cardiovascular status: blood pressure returned to baseline and stable Postop Assessment: no apparent nausea or vomiting Anesthetic complications: no    Last Vitals:  Vitals:   05/07/18 1300 05/07/18 1600  BP: (!) 194/68 (!) 150/63  Pulse: (!) 59 (!) 55  Resp: 10 17  Temp: 36.4 C 36.4 C  SpO2: 100% 90%    Last Pain:  Vitals:   05/07/18 1600  TempSrc: Oral                 Loyal Rudy,E. Lionell Matuszak

## 2018-05-07 NOTE — Discharge Instructions (Signed)
YOU HAD AN ENDOSCOPIC PROCEDURE TODAY: Refer to the procedure report and other information in the discharge instructions given to you for any specific questions about what was found during the examination. If this information does not answer your questions, please call [facility] office at [phone] to clarify.   YOU SHOULD EXPECT: Some feelings of bloating in the abdomen. Passage of more gas than usual. Walking can help get rid of the air that was put into your GI tract during the procedure and reduce the bloating. If you had a lower endoscopy (such as a colonoscopy or flexible sigmoidoscopy) you may notice spotting of blood in your stool or on the toilet paper. Some abdominal soreness may be present for a day or two, also.  DIET: Your first meal following the procedure should be a light meal and then it is ok to progress to your normal diet. A half-sandwich or bowl of soup is an example of a good first meal. Heavy or fried foods are harder to digest and may make you feel nauseous or bloated. Drink plenty of fluids but you should avoid alcoholic beverages for 24 hours. If you had a esophageal dilation, please see attached instructions for diet.   ACTIVITY: Your care partner should take you home directly after the procedure. You should plan to take it easy, moving slowly for the rest of the day. You can resume normal activity the day after the procedure however YOU SHOULD NOT DRIVE, use power tools, machinery or perform tasks that involve climbing or major physical exertion for 24 hours (because of the sedation medicines used during the test).   SYMPTOMS TO REPORT IMMEDIATELY: A gastroenterologist can be reached at any hour. Please call [phone]  for any of the following symptoms:   Following lower endoscopy (colonoscopy, flexible sigmoidoscopy) Excessive amounts of blood in the stool  Significant tenderness, worsening of abdominal pains  Swelling of the abdomen that is new, acute  Fever of 100  or higher   Following upper endoscopy (EGD, EUS, ERCP, esophageal dilation) Vomiting of blood or coffee ground material  New, significant abdominal pain  New, significant chest pain or pain under the shoulder blades  Painful or persistently difficult swallowing  New shortness of breath  Black, tarry-looking or red, bloody stools  FOLLOW UP:  If any biopsies were taken you will be contacted by phone or by letter within the next 1-3 weeks. Call [phone]  if you have not heard about the biopsies in 3 weeks.  Please also call with any specific questions about appointments or follow up tests.   PLEASE CALL OUR OFFICE [phone] TO ARRANGE A FOLLOW UP OFFICE VISIT IN January TO DISCUSS FURTHER EVALUATION.

## 2018-05-07 NOTE — Anesthesia Preprocedure Evaluation (Signed)
Anesthesia Evaluation  Patient identified by MRN, date of birth, ID band Patient awake    Reviewed: Allergy & Precautions, NPO status , Patient's Chart, lab work & pertinent test results  History of Anesthesia Complications Negative for: history of anesthetic complications  Airway Mallampati: IV  TM Distance: >3 FB   Mouth opening: Limited Mouth Opening  Dental  (+) Caps, Dental Advisory Given   Pulmonary neg pulmonary ROS,    breath sounds clear to auscultation       Cardiovascular hypertension, Pt. on medications (-) angina Rhythm:Regular Rate:Normal     Neuro/Psych H/o meningioma: s/p resection and XRT Facial nerve palsy Pituitary adenoma Glaucoma XRT scarring of TMJ    GI/Hepatic Neg liver ROS, GERD (esophageal stricture)  ,  Endo/Other    Renal/GU      Musculoskeletal   Abdominal   Peds  Hematology   Anesthesia Other Findings   Reproductive/Obstetrics                             Anesthesia Physical Anesthesia Plan  ASA: III  Anesthesia Plan: MAC   Post-op Pain Management:    Induction:   PONV Risk Score and Plan: 2 and Treatment may vary due to age or medical condition  Airway Management Planned: Natural Airway and Nasal Cannula  Additional Equipment:   Intra-op Plan:   Post-operative Plan:   Informed Consent: I have reviewed the patients History and Physical, chart, labs and discussed the procedure including the risks, benefits and alternatives for the proposed anesthesia with the patient or authorized representative who has indicated his/her understanding and acceptance.   Dental advisory given  Plan Discussed with: CRNA and Surgeon  Anesthesia Plan Comments: (Plan routine monitors, MAC)        Anesthesia Quick Evaluation

## 2018-05-07 NOTE — H&P (Signed)
Tara Kennedy is an 57 y.o. female.   Chief Complaint: Screening for colon cancer HPI: This is a 57 year old female who presents for her initial screening colonoscopy without any family history of colon cancer or worrisome GI symptoms, but because of facial deformities and multiple jaw operations, she is only able to open her mouth a small amount.  Past Medical History:  Diagnosis Date  . Glaucoma 03/04/2012  . HTN (hypertension), benign 03/04/2012  . Hyperlipidemia 03/04/2012  . Meningioma ([place]) 03/04/2012   Dx 7/10; major resection at [facility], Dr Ernie Avena then [place]  Dr Leonel Ramsay  . Retinal vein thrombosis, left 02/20/2012    History reviewed. No pertinent surgical history.  Family History  Problem Relation Age of Onset  . Breast cancer Mother   . Breast cancer Sister    Social History:  reports that she has never smoked. She has never used smokeless tobacco. She reports that she drinks about 3.0 standard drinks of alcohol per week. Her drug history is not on file.  Allergies:  Allergies  Allergen Reactions  . Latex Other (See Comments) and Swelling    Gloves - lips swell with dentist latex gloves Other Reaction: swollen lips from dentist     Medications Prior to Admission  Medication Sig Dispense Refill  . atorvastatin (LIPITOR) 20 MG tablet Take 20 mg by mouth daily.     . Calcium-Vitamin D-Vitamin K (VIACTIV CALCIUM PLUS D) 650-12.5-40 MG-MCG-MCG CHEW Chew 1 each by mouth 3 (three) times daily.    . dorzolamide-timolol (COSOPT) 22.3-6.8 MG/ML ophthalmic solution Place 1 drop into both eyes 2 (two) times daily.   11  . lisinopril (PRINIVIL,ZESTRIL) 10 MG tablet Take 10 mg by mouth daily.       No results found for this or any previous visit (from the past 48 hour(s)). No results found.  ROS not obtained  Blood pressure (!) 194/68, pulse (!) 59, temperature 97.6 F (36.4 C), temperature source Oral, resp. rate 10, height 5\' 5"  (1.651 m), weight 63.5 kg, SpO2 100  %. Physical Exam pleasant, healthy appearing female with mild residual facial deformities.  Chest clear, heart without murmur or arrhythmia, abdomen without mass or tenderness.  No pallor, no icterus.  Seems to be cognitively intact.  Assessment/Plan Patient at "standard risk" for colon cancer, without worrisome symptoms but with potential airway issues because of limited jaw mobility.  Proceed to colonoscopic evaluation here at the hospital as an outpatient to have anesthesia backup in the event of any airway issues.  Cleotis Nipper, MD 05/07/2018, 2:46 PM

## 2018-05-07 NOTE — Transfer of Care (Signed)
Immediate Anesthesia Transfer of Care Note  Patient: Tara Kennedy  Procedure(s) Performed: COLONOSCOPY WITH PROPOFOL (N/A )  Patient Location: Endoscopy Unit  Anesthesia Type:MAC  Level of Consciousness: drowsy and patient cooperative  Airway & Oxygen Therapy: Patient Spontanous Breathing and Patient connected to face mask oxygen  Post-op Assessment: Report given to RN and Post -op Vital signs reviewed and stable  Post vital signs: Reviewed and stable  Last Vitals:  Vitals Value Taken Time  BP 149/71 05/07/2018  4:10 PM  Temp 36.4 C 05/07/2018  4:00 PM  Pulse 55 05/07/2018  4:11 PM  Resp 14 05/07/2018  4:11 PM  SpO2 100 % 05/07/2018  4:11 PM  Vitals shown include unvalidated device data.  Last Pain:  Vitals:   05/07/18 1600  TempSrc: Oral         Complications: No apparent anesthesia complications

## 2018-05-08 ENCOUNTER — Encounter (HOSPITAL_COMMUNITY): Payer: Self-pay | Admitting: Gastroenterology

## 2018-05-22 DIAGNOSIS — D352 Benign neoplasm of pituitary gland: Secondary | ICD-10-CM | POA: Diagnosis not present

## 2018-05-22 DIAGNOSIS — D320 Benign neoplasm of cerebral meninges: Secondary | ICD-10-CM | POA: Diagnosis not present

## 2018-05-22 DIAGNOSIS — G510 Bell's palsy: Secondary | ICD-10-CM | POA: Diagnosis not present

## 2018-05-22 DIAGNOSIS — Z923 Personal history of irradiation: Secondary | ICD-10-CM | POA: Diagnosis not present

## 2018-06-08 DIAGNOSIS — L814 Other melanin hyperpigmentation: Secondary | ICD-10-CM | POA: Diagnosis not present

## 2018-06-08 DIAGNOSIS — Z85828 Personal history of other malignant neoplasm of skin: Secondary | ICD-10-CM | POA: Diagnosis not present

## 2018-06-08 DIAGNOSIS — D2261 Melanocytic nevi of right upper limb, including shoulder: Secondary | ICD-10-CM | POA: Diagnosis not present

## 2018-06-09 DIAGNOSIS — Z1211 Encounter for screening for malignant neoplasm of colon: Secondary | ICD-10-CM | POA: Diagnosis not present

## 2018-06-10 DIAGNOSIS — H401131 Primary open-angle glaucoma, bilateral, mild stage: Secondary | ICD-10-CM | POA: Diagnosis not present

## 2018-06-10 DIAGNOSIS — H2513 Age-related nuclear cataract, bilateral: Secondary | ICD-10-CM | POA: Diagnosis not present

## 2018-08-06 DIAGNOSIS — N8110 Cystocele, unspecified: Secondary | ICD-10-CM | POA: Diagnosis not present

## 2018-08-18 DIAGNOSIS — E7800 Pure hypercholesterolemia, unspecified: Secondary | ICD-10-CM | POA: Diagnosis not present

## 2018-08-18 DIAGNOSIS — Z0001 Encounter for general adult medical examination with abnormal findings: Secondary | ICD-10-CM | POA: Diagnosis not present

## 2018-08-21 DIAGNOSIS — H35033 Hypertensive retinopathy, bilateral: Secondary | ICD-10-CM | POA: Diagnosis not present

## 2018-08-21 DIAGNOSIS — H2513 Age-related nuclear cataract, bilateral: Secondary | ICD-10-CM | POA: Diagnosis not present

## 2018-08-21 DIAGNOSIS — H401131 Primary open-angle glaucoma, bilateral, mild stage: Secondary | ICD-10-CM | POA: Diagnosis not present

## 2018-08-24 DIAGNOSIS — M26611 Adhesions and ankylosis of right temporomandibular joint: Secondary | ICD-10-CM | POA: Diagnosis not present

## 2018-08-24 DIAGNOSIS — K135 Oral submucous fibrosis: Secondary | ICD-10-CM | POA: Diagnosis not present

## 2018-08-24 DIAGNOSIS — H66002 Acute suppurative otitis media without spontaneous rupture of ear drum, left ear: Secondary | ICD-10-CM | POA: Diagnosis not present

## 2018-08-31 DIAGNOSIS — H43813 Vitreous degeneration, bilateral: Secondary | ICD-10-CM | POA: Diagnosis not present

## 2018-08-31 DIAGNOSIS — H348322 Tributary (branch) retinal vein occlusion, left eye, stable: Secondary | ICD-10-CM | POA: Diagnosis not present

## 2018-08-31 DIAGNOSIS — H3562 Retinal hemorrhage, left eye: Secondary | ICD-10-CM | POA: Diagnosis not present

## 2018-12-07 ENCOUNTER — Other Ambulatory Visit: Payer: Self-pay | Admitting: Obstetrics & Gynecology

## 2018-12-07 DIAGNOSIS — Z1231 Encounter for screening mammogram for malignant neoplasm of breast: Secondary | ICD-10-CM

## 2019-01-15 ENCOUNTER — Ambulatory Visit: Payer: BLUE CROSS/BLUE SHIELD

## 2019-02-16 ENCOUNTER — Encounter: Payer: Self-pay | Admitting: Podiatry

## 2019-02-16 ENCOUNTER — Ambulatory Visit (INDEPENDENT_AMBULATORY_CARE_PROVIDER_SITE_OTHER): Payer: 59 | Admitting: Podiatry

## 2019-02-16 ENCOUNTER — Other Ambulatory Visit: Payer: Self-pay

## 2019-02-16 ENCOUNTER — Other Ambulatory Visit: Payer: Self-pay | Admitting: Podiatry

## 2019-02-16 ENCOUNTER — Ambulatory Visit (INDEPENDENT_AMBULATORY_CARE_PROVIDER_SITE_OTHER): Payer: 59

## 2019-02-16 DIAGNOSIS — M7751 Other enthesopathy of right foot: Secondary | ICD-10-CM | POA: Diagnosis not present

## 2019-02-16 DIAGNOSIS — M84374A Stress fracture, right foot, initial encounter for fracture: Secondary | ICD-10-CM | POA: Diagnosis not present

## 2019-02-16 DIAGNOSIS — M778 Other enthesopathies, not elsewhere classified: Secondary | ICD-10-CM

## 2019-02-16 DIAGNOSIS — M2041 Other hammer toe(s) (acquired), right foot: Secondary | ICD-10-CM

## 2019-02-16 NOTE — Progress Notes (Signed)
She presents today dorsal aspect of the second foot right toe aching and swelling x1 month after coming back from the beach.  She denies any trauma.  ROS: Denies fever chills nausea vomiting muscle aches pains calf pain back pain chest pain shortness of breath.  Objective: Vital signs are stable alert and oriented x3.  Pulses are palpable.  Neurologic sensorium is intact dT reflexes are intact muscle strength is normal and symmetrical.  She has pain limited range of motion of the second metatarsal phalangeal joint of the right foot.  Cutaneous evaluation demonstrates supple well-hydrated cutis there is no erythema edema cellulitis drainage or odor.  Assessment: Assessment capsulitis of the second metatarsal phalangeal joint right.  Plan: At this point after sterile Betadine skin prep I injected 10 mg Kenalog 5 mg Marcaine point maximal tenderness around the second metatarsal phalangeal joint.  We discussed appropriate shoe gear stretching exercise ice therapy and shoe gear modifications I will follow-up with her in 1 month.

## 2019-03-01 ENCOUNTER — Other Ambulatory Visit: Payer: Self-pay

## 2019-03-01 ENCOUNTER — Ambulatory Visit
Admission: RE | Admit: 2019-03-01 | Discharge: 2019-03-01 | Disposition: A | Discharge status: Home / Self Care | Payer: 59 | Source: Ambulatory Visit | Attending: Obstetrics & Gynecology | Admitting: Obstetrics & Gynecology

## 2019-03-01 DIAGNOSIS — Z1231 Encounter for screening mammogram for malignant neoplasm of breast: Secondary | ICD-10-CM

## 2019-03-08 DIAGNOSIS — M952 Other acquired deformity of head: Secondary | ICD-10-CM | POA: Insufficient documentation

## 2019-03-08 DIAGNOSIS — H6983 Other specified disorders of Eustachian tube, bilateral: Secondary | ICD-10-CM | POA: Insufficient documentation

## 2019-03-08 DIAGNOSIS — M2609 Other specified anomalies of jaw size: Secondary | ICD-10-CM | POA: Insufficient documentation

## 2019-03-08 DIAGNOSIS — H6993 Unspecified Eustachian tube disorder, bilateral: Secondary | ICD-10-CM | POA: Insufficient documentation

## 2019-03-08 DIAGNOSIS — Z9889 Other specified postprocedural states: Secondary | ICD-10-CM | POA: Insufficient documentation

## 2019-03-08 DIAGNOSIS — G9601 Cranial cerebrospinal fluid leak, spontaneous: Secondary | ICD-10-CM | POA: Insufficient documentation

## 2019-03-11 DIAGNOSIS — H6692 Otitis media, unspecified, left ear: Secondary | ICD-10-CM | POA: Insufficient documentation

## 2019-03-16 ENCOUNTER — Ambulatory Visit: Payer: 59 | Admitting: Podiatry

## 2019-04-08 DIAGNOSIS — H7012 Chronic mastoiditis, left ear: Secondary | ICD-10-CM | POA: Insufficient documentation

## 2019-06-10 ENCOUNTER — Other Ambulatory Visit: Payer: Self-pay

## 2019-06-10 ENCOUNTER — Encounter: Payer: Self-pay | Admitting: Podiatry

## 2019-06-10 ENCOUNTER — Ambulatory Visit (INDEPENDENT_AMBULATORY_CARE_PROVIDER_SITE_OTHER): Payer: BC Managed Care – PPO | Admitting: Podiatry

## 2019-06-10 ENCOUNTER — Ambulatory Visit (INDEPENDENT_AMBULATORY_CARE_PROVIDER_SITE_OTHER): Payer: BC Managed Care – PPO

## 2019-06-10 DIAGNOSIS — M778 Other enthesopathies, not elsewhere classified: Secondary | ICD-10-CM

## 2019-06-10 DIAGNOSIS — M2032 Hallux varus (acquired), left foot: Secondary | ICD-10-CM | POA: Diagnosis not present

## 2019-06-10 DIAGNOSIS — M2580 Other specified joint disorders, unspecified joint: Secondary | ICD-10-CM | POA: Diagnosis not present

## 2019-06-10 NOTE — Progress Notes (Signed)
She presents today for follow-up of capsulitis second metatarsophalangeal joint of the right foot states that is better though sometimes looks a little bruised on top but all in all is doing really well.  She is also complaining of pain to the first metatarsophalangeal joint of the left foot states that she has developed some sharp stabbing pains on the plantar medial aspect and across the top.  Objective: Vital signs are stable alert and oriented x3.  Pulses are palpable.  Neurologic sensorium is intact deep and reflexes are intact muscle strength is normal symmetrical.  She has pain on palpation of the first metatarsal phalangeal joint.  Radiographs do not demonstrate any type of osseous abnormalities in this area.  Assessment: Capsulitis sesamoiditis of the first metatarsophalangeal joint left foot.  Plan: I injected the area today with Kenalog and local anesthetic 20 mg was injected with local anesthetic.  Tolerated procedure well without complications.

## 2019-06-23 DIAGNOSIS — H401131 Primary open-angle glaucoma, bilateral, mild stage: Secondary | ICD-10-CM | POA: Diagnosis not present

## 2019-06-23 DIAGNOSIS — H2513 Age-related nuclear cataract, bilateral: Secondary | ICD-10-CM | POA: Diagnosis not present

## 2019-08-23 DIAGNOSIS — L570 Actinic keratosis: Secondary | ICD-10-CM | POA: Diagnosis not present

## 2019-08-23 DIAGNOSIS — D225 Melanocytic nevi of trunk: Secondary | ICD-10-CM | POA: Diagnosis not present

## 2019-08-23 DIAGNOSIS — Z85828 Personal history of other malignant neoplasm of skin: Secondary | ICD-10-CM | POA: Diagnosis not present

## 2019-08-23 DIAGNOSIS — L853 Xerosis cutis: Secondary | ICD-10-CM | POA: Diagnosis not present

## 2019-08-23 DIAGNOSIS — L820 Inflamed seborrheic keratosis: Secondary | ICD-10-CM | POA: Diagnosis not present

## 2019-08-23 DIAGNOSIS — D2261 Melanocytic nevi of right upper limb, including shoulder: Secondary | ICD-10-CM | POA: Diagnosis not present

## 2019-09-08 DIAGNOSIS — I10 Essential (primary) hypertension: Secondary | ICD-10-CM | POA: Diagnosis not present

## 2019-09-08 DIAGNOSIS — E7800 Pure hypercholesterolemia, unspecified: Secondary | ICD-10-CM | POA: Diagnosis not present

## 2019-09-23 ENCOUNTER — Ambulatory Visit (INDEPENDENT_AMBULATORY_CARE_PROVIDER_SITE_OTHER): Payer: BC Managed Care – PPO | Admitting: Orthopaedic Surgery

## 2019-09-23 ENCOUNTER — Other Ambulatory Visit: Payer: Self-pay

## 2019-09-23 ENCOUNTER — Encounter: Payer: Self-pay | Admitting: Orthopaedic Surgery

## 2019-09-23 DIAGNOSIS — M79672 Pain in left foot: Secondary | ICD-10-CM

## 2019-09-23 MED ORDER — LIDOCAINE HCL 1 % IJ SOLN
1.0000 mL | INTRAMUSCULAR | Status: AC | PRN
  Administered 2019-09-23: 1 mL

## 2019-09-23 MED ORDER — METHYLPREDNISOLONE ACETATE 40 MG/ML IJ SUSP
20.0000 mg | INTRAMUSCULAR | Status: AC | PRN
  Administered 2019-09-23: 20 mg via INTRA_ARTICULAR

## 2019-09-23 NOTE — Progress Notes (Signed)
Office Visit Note   Patient: Tara Kennedy           Date of Birth: 1961/04/19           MRN: [id number] Visit Date: 09/23/2019              Requested by: Aretta Nip, [place],  [place] [address] PCP: Aretta Nip, MD   Assessment & Plan: Visit Diagnoses:  1. Pain in left foot     Plan: Left foot pain is localized to the second metatarsal phalangeal joint.  I suspect that there is some early synovitis and degenerative change.  Will inject that joint with Depo-Medrol and monitor response.  Excellent initial response  Follow-Up Instructions: Return if symptoms worsen or fail to improve.   Orders:  No orders of the defined types were placed in this encounter.  No orders of the defined types were placed in this encounter.     Procedures: Small Joint Inj: L second MTP on 09/23/2019 3:37 PM Medications: 1 mL lidocaine 1 %; 20 mg methylPREDNISolone acetate 40 MG/ML      Clinical Data: No additional findings.   Subjective: Chief Complaint  Patient presents with  . Left Foot - Pain  Patient presents today for left foot pain. She has been having pain all throughout her foot in the last month. No known injury. She states that it feels swollen, but she cannot see any swelling. She said that it feels like a ball between and first, second, and third toes. She does not take anything for pain. She states that it throbs when resting, but worse with weightbearing. She has a history of a bunionectomy with Dr.Hyatt many years ago.  Surgery involved probable chevron osteotomy of the great toe and shortening procedure of the second metatarsal.  Just recently has developed some discomfort over the last several months particularly localized to the second metatarsal phalangeal joint.  No specific injury or trauma. I reviewed the films of the left foot on the PACS system from January 2020.  There is hardware the great metatarsal head from her chevron  osteotomy in good position.  There is a hallux varus position of the toe probably 15 degrees with degenerative changes at the metatarsal phalangeal joint with narrowing.  Also a transfixion screw in the second metatarsal head from a shortening procedure.  There appears to be some very mild narrowing of that joint space as well in the metatarsal phalangeal joint  HPI  Review of Systems  Constitutional: Negative for fatigue.  HENT: Negative for ear pain.   Eyes: Negative for pain.  Respiratory: Negative for shortness of breath.   Cardiovascular: Negative for leg swelling.  Gastrointestinal: Negative for constipation and diarrhea.  Endocrine: Negative for cold intolerance and heat intolerance.  Genitourinary: Negative for difficulty urinating.  Musculoskeletal: Negative for joint swelling.  Skin: Negative for rash.  Allergic/Immunologic: Negative for food allergies.  Neurological: Negative for weakness.  Hematological: Does not bruise/bleed easily.  Psychiatric/Behavioral: Negative for sleep disturbance.     Objective: Vital Signs: Ht 5\' 5"  (1.651 m)   Wt 150 lb (68 kg)   BMI 24.96 kg/m   Physical Exam Constitutional:      Appearance: She is well-developed.  Eyes:     Pupils: Pupils are equal, round, and reactive to light.  Pulmonary:     Effort: Pulmonary effort is normal.  Skin:    General: Skin is warm and dry.  Neurological:  Mental Status: She is alert and oriented to person, place, and time.  Psychiatric:        Behavior: Behavior normal.     Ortho Exam awake alert and oriented x3.  Comfortable sitting.  Examination of the left foot demonstrates a hallux varus.  I could correct to neutral.  There is a well-healed surgical scar on the dorsum of the great toe.  Excellent range of motion in flexion and extension.  Flexor and extensor tendons intact.  Good capillary refill to the toe normal sensibility.  Well-healed incision dorsally of the second metatarsal phalangeal  joint.  There is some tenderness directly over the joint dorsally but not plantarly.  Normal alignment of the toe.  No pain in the intermetatarsal region either toe.  No plantar calluses.  No midfoot, plantar or Achilles pain.  Specialty Comments:  No specialty comments available.  Imaging: No results found.   PMFS History: Patient Active Problem List   Diagnosis Date Noted  . Pain in left foot 09/23/2019  . Chronic mastoiditis of left side 04/08/2019  . Otitis media follow-up, not resolved, left 03/11/2019  . Absence of mandibular condyle 03/08/2019  . Acquired facial deformity 03/08/2019  . CSF otorrhea 03/08/2019  . Eustachian tube dysfunction, bilateral 03/08/2019  . Other specified postprocedural states 03/08/2019  . Pituitary adenoma ([place]) 03/26/2017  . Sensorineural hearing loss (SNHL) of right ear with restricted hearing of left ear 03/24/2017  . Trismus 03/24/2017  . Hypertensive retinopathy of both eyes 04/25/2015  . Nuclear sclerotic cataract of both eyes 04/25/2015  . Benign meningioma of brain ([place]) 12/09/2014  . Primary open angle glaucoma of both eyes, mild stage 02/26/2013  . Meningioma ([place]) 03/04/2012  . Glaucoma 03/04/2012  . HTN (hypertension), benign 03/04/2012  . Hyperlipidemia 03/04/2012  . Retinal vein thrombosis, left 02/20/2012  . Blepharospasm 01/01/2012  . 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 Glaucoma 03/04/2012  . HTN (hypertension), benign 03/04/2012  . Hyperlipidemia 03/04/2012  . Meningioma ([place]) 03/04/2012   Dx 7/10; major resection at [facility], Dr Ernie Avena then [place]  Dr Leonel Ramsay  . Retinal vein thrombosis, left 02/20/2012    Family History  Problem Relation Age of Onset  . Breast cancer Mother   . Breast cancer Sister     Past Surgical History:  Procedure Laterality Date  . COLONOSCOPY WITH PROPOFOL N/A 05/07/2018   Procedure: COLONOSCOPY WITH PROPOFOL;  Surgeon: Ronald Lobo, MD;   Location: [facility] ENDOSCOPY;  Service: Endoscopy;  Laterality: N/A;   Social History   Occupational History  . Not on file  Tobacco Use  . Smoking status: Never Smoker  . Smokeless tobacco: Never Used  Substance and Sexual Activity  . Alcohol use: Yes    Alcohol/week: 3.0 standard drinks    Types: 3 Glasses of wine per week  . Drug use: Not on file  . Sexual activity: Not on file

## 2019-09-27 DIAGNOSIS — Z1211 Encounter for screening for malignant neoplasm of colon: Secondary | ICD-10-CM | POA: Diagnosis not present

## 2019-10-01 DIAGNOSIS — H903 Sensorineural hearing loss, bilateral: Secondary | ICD-10-CM | POA: Diagnosis not present

## 2019-10-01 DIAGNOSIS — R252 Cramp and spasm: Secondary | ICD-10-CM | POA: Diagnosis not present

## 2019-10-01 DIAGNOSIS — H6983 Other specified disorders of Eustachian tube, bilateral: Secondary | ICD-10-CM | POA: Diagnosis not present

## 2019-10-01 DIAGNOSIS — H6692 Otitis media, unspecified, left ear: Secondary | ICD-10-CM | POA: Diagnosis not present

## 2019-11-09 DIAGNOSIS — H9212 Otorrhea, left ear: Secondary | ICD-10-CM | POA: Diagnosis not present

## 2019-11-09 DIAGNOSIS — Z91040 Latex allergy status: Secondary | ICD-10-CM | POA: Diagnosis not present

## 2019-11-09 DIAGNOSIS — H903 Sensorineural hearing loss, bilateral: Secondary | ICD-10-CM | POA: Diagnosis not present

## 2019-11-09 DIAGNOSIS — H7012 Chronic mastoiditis, left ear: Secondary | ICD-10-CM | POA: Diagnosis not present

## 2019-11-09 DIAGNOSIS — H6983 Other specified disorders of Eustachian tube, bilateral: Secondary | ICD-10-CM | POA: Diagnosis not present

## 2020-01-04 DIAGNOSIS — H9212 Otorrhea, left ear: Secondary | ICD-10-CM | POA: Diagnosis not present

## 2020-01-04 DIAGNOSIS — H90A21 Sensorineural hearing loss, unilateral, right ear, with restricted hearing on the contralateral side: Secondary | ICD-10-CM | POA: Diagnosis not present

## 2020-01-04 DIAGNOSIS — G510 Bell's palsy: Secondary | ICD-10-CM | POA: Diagnosis not present

## 2020-01-04 DIAGNOSIS — H7012 Chronic mastoiditis, left ear: Secondary | ICD-10-CM | POA: Diagnosis not present

## 2020-01-04 DIAGNOSIS — H401131 Primary open-angle glaucoma, bilateral, mild stage: Secondary | ICD-10-CM | POA: Diagnosis not present

## 2020-01-04 DIAGNOSIS — Z9622 Myringotomy tube(s) status: Secondary | ICD-10-CM | POA: Diagnosis not present

## 2020-01-11 DIAGNOSIS — H35033 Hypertensive retinopathy, bilateral: Secondary | ICD-10-CM | POA: Diagnosis not present

## 2020-01-11 DIAGNOSIS — H401131 Primary open-angle glaucoma, bilateral, mild stage: Secondary | ICD-10-CM | POA: Diagnosis not present

## 2020-01-11 DIAGNOSIS — I10 Essential (primary) hypertension: Secondary | ICD-10-CM | POA: Diagnosis not present

## 2020-01-11 DIAGNOSIS — H2513 Age-related nuclear cataract, bilateral: Secondary | ICD-10-CM | POA: Diagnosis not present

## 2020-01-12 DIAGNOSIS — H401131 Primary open-angle glaucoma, bilateral, mild stage: Secondary | ICD-10-CM | POA: Diagnosis not present

## 2020-01-26 ENCOUNTER — Other Ambulatory Visit: Payer: Self-pay | Admitting: Obstetrics & Gynecology

## 2020-01-26 DIAGNOSIS — Z1231 Encounter for screening mammogram for malignant neoplasm of breast: Secondary | ICD-10-CM

## 2020-03-01 ENCOUNTER — Other Ambulatory Visit: Payer: Self-pay

## 2020-03-01 ENCOUNTER — Ambulatory Visit
Admission: RE | Admit: 2020-03-01 | Discharge: 2020-03-01 | Disposition: A | Discharge status: Home / Self Care | Payer: BC Managed Care – PPO | Source: Ambulatory Visit | Attending: Obstetrics & Gynecology | Admitting: Obstetrics & Gynecology

## 2020-03-01 DIAGNOSIS — Z1231 Encounter for screening mammogram for malignant neoplasm of breast: Secondary | ICD-10-CM | POA: Diagnosis not present

## 2020-04-19 DIAGNOSIS — Z6827 Body mass index (BMI) 27.0-27.9, adult: Secondary | ICD-10-CM | POA: Diagnosis not present

## 2020-04-19 DIAGNOSIS — Z13820 Encounter for screening for osteoporosis: Secondary | ICD-10-CM | POA: Diagnosis not present

## 2020-04-19 DIAGNOSIS — Z01419 Encounter for gynecological examination (general) (routine) without abnormal findings: Secondary | ICD-10-CM | POA: Diagnosis not present

## 2020-04-20 DIAGNOSIS — H7312 Chronic myringitis, left ear: Secondary | ICD-10-CM | POA: Diagnosis not present

## 2020-04-20 DIAGNOSIS — G9601 Cranial cerebrospinal fluid leak, spontaneous: Secondary | ICD-10-CM | POA: Diagnosis not present

## 2020-04-20 DIAGNOSIS — H90A21 Sensorineural hearing loss, unilateral, right ear, with restricted hearing on the contralateral side: Secondary | ICD-10-CM | POA: Diagnosis not present

## 2020-04-20 DIAGNOSIS — H903 Sensorineural hearing loss, bilateral: Secondary | ICD-10-CM | POA: Diagnosis not present

## 2020-05-15 DIAGNOSIS — M25531 Pain in right wrist: Secondary | ICD-10-CM | POA: Diagnosis not present

## 2020-05-15 DIAGNOSIS — M180 Bilateral primary osteoarthritis of first carpometacarpal joints: Secondary | ICD-10-CM | POA: Diagnosis not present

## 2020-05-15 DIAGNOSIS — M25532 Pain in left wrist: Secondary | ICD-10-CM | POA: Diagnosis not present

## 2020-05-18 ENCOUNTER — Ambulatory Visit (INDEPENDENT_AMBULATORY_CARE_PROVIDER_SITE_OTHER): Payer: BC Managed Care – PPO | Admitting: Orthopaedic Surgery

## 2020-05-18 ENCOUNTER — Encounter: Payer: Self-pay | Admitting: Orthopaedic Surgery

## 2020-05-18 ENCOUNTER — Other Ambulatory Visit: Payer: Self-pay

## 2020-05-18 DIAGNOSIS — L84 Corns and callosities: Secondary | ICD-10-CM | POA: Insufficient documentation

## 2020-05-18 DIAGNOSIS — M25572 Pain in left ankle and joints of left foot: Secondary | ICD-10-CM

## 2020-05-18 NOTE — Progress Notes (Signed)
Office Visit Note   Patient: Tara Kennedy           Date of Birth: 11-Aug-1960           MRN: [id number] Visit Date: 05/18/2020              Requested by: Aretta Nip, [place],  [place] [id number] PCP: Aretta Nip, MD   Assessment & Plan: Visit Diagnoses:  1. Plantar callus     Plan: Plantar callus over the left great metatarsal head.  I shaved this including a small kernel at the center portion of the callus.  This provided significant relief of her pain.  Instructed on use of a pumice stone and good comfortable shoes.  Plan to see her back as needed  Follow-Up Instructions: Return if symptoms worsen or fail to improve.   Orders:  No orders of the defined types were placed in this encounter.  No orders of the defined types were placed in this encounter.     Procedures: No procedures performed   Clinical Data: No additional findings.   Subjective: Chief Complaint  Patient presents with  . Left Foot - Pain  Patient presents today for left foot pain. She said that she has been having pain with weightbearing. Her pain is located on the bottom of her foot, near the head of her first metatarsal. She also has a callous in that area. She said that her pain is worse with walking on hard surfaces. She has not tried to file that area down.   HPI  Review of Systems   Objective: Vital Signs: Ht 5\' 5"  (1.651 m)   Wt 150 lb (68 kg)   BMI 24.96 kg/m   Physical Exam Constitutional:      Appearance: She is well-developed and well-nourished.  HENT:     Mouth/Throat:     Mouth: Oropharynx is clear and moist.  Eyes:     Extraocular Movements: EOM normal.     Pupils: Pupils are equal, round, and reactive to light.  Pulmonary:     Effort: Pulmonary effort is normal.  Skin:    General: Skin is warm and dry.  Neurological:     Mental Status: She is alert and oriented to person, place, and time.  Psychiatric:        Mood and  Affect: Mood and affect normal.        Behavior: Behavior normal.     Ortho Exam awake alert and oriented x3.  Comfortable sitting.  Exam limited to the left foot.  Prior bunion surgery.  Does have hallux varus but no swelling or pain about the great toe other than on the plantar surface where there is a thickened callus directly over the metatarsal head.  This was shaved including a central kernel with relief of her pain.  Specialty Comments:  No specialty comments available.  Imaging: No results found.   PMFS History: Patient Active Problem List   Diagnosis Date Noted  . Plantar callus 05/18/2020  . Pain in left foot 09/23/2019  . Chronic mastoiditis of left side 04/08/2019  . Otitis media follow-up, not resolved, left 03/11/2019  . Absence of mandibular condyle 03/08/2019  . Acquired facial deformity 03/08/2019  . CSF otorrhea 03/08/2019  . Eustachian tube dysfunction, bilateral 03/08/2019  . Other specified postprocedural states 03/08/2019  . Pituitary adenoma ([place]) 03/26/2017  . Sensorineural hearing loss (SNHL) of right ear with restricted hearing of left ear 03/24/2017  .  Trismus 03/24/2017  . Hypertensive retinopathy of both eyes 04/25/2015  . Nuclear sclerotic cataract of both eyes 04/25/2015  . Benign meningioma of brain ([place]) 12/09/2014  . Primary open angle glaucoma of both eyes, mild stage 02/26/2013  . Meningioma ([place]) 03/04/2012  . Glaucoma 03/04/2012  . HTN (hypertension), benign 03/04/2012  . Hyperlipidemia 03/04/2012  . Retinal vein thrombosis, left 02/20/2012  . Blepharospasm 01/01/2012  . 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 Glaucoma 03/04/2012  . HTN (hypertension), benign 03/04/2012  . Hyperlipidemia 03/04/2012  . Meningioma ([place]) 03/04/2012   Dx 7/10; major resection at [facility], Dr Ernie Avena then [place]  Dr Leonel Ramsay  . Retinal vein thrombosis, left 02/20/2012    Family History  Problem Relation Age of  Onset  . Breast cancer Mother   . Breast cancer Sister     Past Surgical History:  Procedure Laterality Date  . COLONOSCOPY WITH PROPOFOL N/A 05/07/2018   Procedure: COLONOSCOPY WITH PROPOFOL;  Surgeon: Ronald Lobo, MD;  Location: [facility];  Service: Endoscopy;  Laterality: N/A;   Social History   Occupational History  . Not on file  Tobacco Use  . Smoking status: Never Smoker  . Smokeless tobacco: Never Used  Substance and Sexual Activity  . Alcohol use: Yes    Alcohol/week: 3.0 standard drinks    Types: 3 Glasses of wine per week  . Drug use: Not on file  . Sexual activity: Not on file

## 2020-06-15 DIAGNOSIS — H90A22 Sensorineural hearing loss, unilateral, left ear, with restricted hearing on the contralateral side: Secondary | ICD-10-CM | POA: Diagnosis not present

## 2020-06-15 DIAGNOSIS — Z9889 Other specified postprocedural states: Secondary | ICD-10-CM | POA: Diagnosis not present

## 2020-06-15 DIAGNOSIS — H65195 Other acute nonsuppurative otitis media, recurrent, left ear: Secondary | ICD-10-CM | POA: Diagnosis not present

## 2020-06-15 DIAGNOSIS — H7312 Chronic myringitis, left ear: Secondary | ICD-10-CM | POA: Diagnosis not present

## 2020-06-15 DIAGNOSIS — H90A21 Sensorineural hearing loss, unilateral, right ear, with restricted hearing on the contralateral side: Secondary | ICD-10-CM | POA: Diagnosis not present

## 2020-06-15 DIAGNOSIS — H9202 Otalgia, left ear: Secondary | ICD-10-CM | POA: Diagnosis not present

## 2020-06-15 DIAGNOSIS — R252 Cramp and spasm: Secondary | ICD-10-CM | POA: Diagnosis not present

## 2020-06-15 DIAGNOSIS — H7012 Chronic mastoiditis, left ear: Secondary | ICD-10-CM | POA: Diagnosis not present

## 2020-06-15 DIAGNOSIS — H401131 Primary open-angle glaucoma, bilateral, mild stage: Secondary | ICD-10-CM | POA: Diagnosis not present

## 2020-06-15 DIAGNOSIS — H9212 Otorrhea, left ear: Secondary | ICD-10-CM | POA: Diagnosis not present

## 2020-07-07 DIAGNOSIS — H7012 Chronic mastoiditis, left ear: Secondary | ICD-10-CM | POA: Diagnosis not present

## 2020-07-07 DIAGNOSIS — H65195 Other acute nonsuppurative otitis media, recurrent, left ear: Secondary | ICD-10-CM | POA: Diagnosis not present

## 2020-07-07 DIAGNOSIS — H6983 Other specified disorders of Eustachian tube, bilateral: Secondary | ICD-10-CM | POA: Diagnosis not present

## 2020-07-07 DIAGNOSIS — G9601 Cranial cerebrospinal fluid leak, spontaneous: Secondary | ICD-10-CM | POA: Diagnosis not present

## 2020-08-28 DIAGNOSIS — Z86018 Personal history of other benign neoplasm: Secondary | ICD-10-CM | POA: Diagnosis not present

## 2020-08-28 DIAGNOSIS — E7800 Pure hypercholesterolemia, unspecified: Secondary | ICD-10-CM | POA: Diagnosis not present

## 2020-08-28 DIAGNOSIS — J302 Other seasonal allergic rhinitis: Secondary | ICD-10-CM | POA: Diagnosis not present

## 2020-08-28 DIAGNOSIS — Z0000 Encounter for general adult medical examination without abnormal findings: Secondary | ICD-10-CM | POA: Diagnosis not present

## 2020-08-28 DIAGNOSIS — I10 Essential (primary) hypertension: Secondary | ICD-10-CM | POA: Diagnosis not present

## 2020-08-31 DIAGNOSIS — G9601 Cranial cerebrospinal fluid leak, spontaneous: Secondary | ICD-10-CM | POA: Diagnosis not present

## 2020-08-31 DIAGNOSIS — H748X1 Other specified disorders of right middle ear and mastoid: Secondary | ICD-10-CM | POA: Diagnosis not present

## 2020-08-31 DIAGNOSIS — D329 Benign neoplasm of meninges, unspecified: Secondary | ICD-10-CM | POA: Diagnosis not present

## 2020-08-31 DIAGNOSIS — H6983 Other specified disorders of Eustachian tube, bilateral: Secondary | ICD-10-CM | POA: Diagnosis not present

## 2020-08-31 DIAGNOSIS — H90A32 Mixed conductive and sensorineural hearing loss, unilateral, left ear with restricted hearing on the contralateral side: Secondary | ICD-10-CM | POA: Diagnosis not present

## 2020-08-31 DIAGNOSIS — H6612 Chronic tubotympanic suppurative otitis media, left ear: Secondary | ICD-10-CM | POA: Diagnosis not present

## 2020-08-31 DIAGNOSIS — H7012 Chronic mastoiditis, left ear: Secondary | ICD-10-CM | POA: Diagnosis not present

## 2020-08-31 DIAGNOSIS — G510 Bell's palsy: Secondary | ICD-10-CM | POA: Diagnosis not present

## 2020-08-31 DIAGNOSIS — H90A21 Sensorineural hearing loss, unilateral, right ear, with restricted hearing on the contralateral side: Secondary | ICD-10-CM | POA: Diagnosis not present

## 2020-09-05 DIAGNOSIS — Z85828 Personal history of other malignant neoplasm of skin: Secondary | ICD-10-CM | POA: Diagnosis not present

## 2020-09-05 DIAGNOSIS — L853 Xerosis cutis: Secondary | ICD-10-CM | POA: Diagnosis not present

## 2020-09-05 DIAGNOSIS — L570 Actinic keratosis: Secondary | ICD-10-CM | POA: Diagnosis not present

## 2020-09-05 DIAGNOSIS — D2221 Melanocytic nevi of right ear and external auricular canal: Secondary | ICD-10-CM | POA: Diagnosis not present

## 2020-09-05 DIAGNOSIS — D225 Melanocytic nevi of trunk: Secondary | ICD-10-CM | POA: Diagnosis not present

## 2020-09-22 DIAGNOSIS — H401131 Primary open-angle glaucoma, bilateral, mild stage: Secondary | ICD-10-CM | POA: Diagnosis not present

## 2020-10-26 DIAGNOSIS — H90A32 Mixed conductive and sensorineural hearing loss, unilateral, left ear with restricted hearing on the contralateral side: Secondary | ICD-10-CM | POA: Diagnosis not present

## 2020-10-27 DIAGNOSIS — H7012 Chronic mastoiditis, left ear: Secondary | ICD-10-CM | POA: Diagnosis not present

## 2020-10-27 DIAGNOSIS — D320 Benign neoplasm of cerebral meninges: Secondary | ICD-10-CM | POA: Diagnosis not present

## 2020-10-27 DIAGNOSIS — H903 Sensorineural hearing loss, bilateral: Secondary | ICD-10-CM | POA: Diagnosis not present

## 2020-10-27 DIAGNOSIS — D352 Benign neoplasm of pituitary gland: Secondary | ICD-10-CM | POA: Diagnosis not present

## 2020-10-27 DIAGNOSIS — H90A21 Sensorineural hearing loss, unilateral, right ear, with restricted hearing on the contralateral side: Secondary | ICD-10-CM | POA: Diagnosis not present

## 2021-01-24 ENCOUNTER — Other Ambulatory Visit: Payer: Self-pay | Admitting: Obstetrics & Gynecology

## 2021-01-24 DIAGNOSIS — Z1231 Encounter for screening mammogram for malignant neoplasm of breast: Secondary | ICD-10-CM

## 2021-03-02 ENCOUNTER — Ambulatory Visit
Admission: RE | Admit: 2021-03-02 | Discharge: 2021-03-02 | Disposition: A | Discharge status: Home / Self Care | Payer: BC Managed Care – PPO | Source: Ambulatory Visit | Attending: Obstetrics & Gynecology | Admitting: Obstetrics & Gynecology

## 2021-03-02 ENCOUNTER — Other Ambulatory Visit: Payer: Self-pay

## 2021-03-02 DIAGNOSIS — Z1231 Encounter for screening mammogram for malignant neoplasm of breast: Secondary | ICD-10-CM

## 2021-03-07 DIAGNOSIS — I10 Essential (primary) hypertension: Secondary | ICD-10-CM | POA: Diagnosis not present

## 2021-03-07 DIAGNOSIS — H6690 Otitis media, unspecified, unspecified ear: Secondary | ICD-10-CM | POA: Diagnosis not present

## 2021-03-07 DIAGNOSIS — D329 Benign neoplasm of meninges, unspecified: Secondary | ICD-10-CM | POA: Diagnosis not present

## 2021-03-07 DIAGNOSIS — Z23 Encounter for immunization: Secondary | ICD-10-CM | POA: Diagnosis not present

## 2021-03-07 DIAGNOSIS — E7800 Pure hypercholesterolemia, unspecified: Secondary | ICD-10-CM | POA: Diagnosis not present

## 2021-03-12 DIAGNOSIS — H903 Sensorineural hearing loss, bilateral: Secondary | ICD-10-CM | POA: Diagnosis not present

## 2021-03-13 ENCOUNTER — Other Ambulatory Visit: Payer: Self-pay

## 2021-03-13 ENCOUNTER — Encounter: Payer: Self-pay | Admitting: Orthopaedic Surgery

## 2021-03-13 ENCOUNTER — Ambulatory Visit (INDEPENDENT_AMBULATORY_CARE_PROVIDER_SITE_OTHER): Payer: BC Managed Care – PPO | Admitting: Orthopaedic Surgery

## 2021-03-13 DIAGNOSIS — M79672 Pain in left foot: Secondary | ICD-10-CM

## 2021-03-13 NOTE — Progress Notes (Signed)
Office Visit Note   Patient: Tara Kennedy           Date of Birth: 06-07-1960           MRN: [id number] Visit Date: 03/13/2021              Requested by: Tara Kennedy, [place],  [place] [address] PCP: Tara Nip, MD   Assessment & Plan: Visit Diagnoses:  1. Pain in left foot     Plan: Tara Kennedy has had a chronic problem with her left foot dating back many years.  She has had prior bunion surgery by one of the local podiatrist and is developed deformity with a great toe.  She is able to function fairly well with a good pair of comfortable athletic shoes but she notes that when she is barefoot she has a significant deformity of her great toe with a varus position.  She also has a callus beneath the first metatarsal phalangeal joint.  I pared this down in December of last year but now she uses a pumice stone and it seems to function just fine.  She has a small bunionette that does not cause her any trouble.  Her biggest issue is the instability of her great toe after her bunion surgery.  For the most part she functions well with a comfortable shoe but she notes that sometimes it is a nuisance.  I think it is worth having Tara Kennedy just evaluate her and see what he thinks in terms of whether should be a candidate for further surgery of the great toe.  Once the great toe was realigned her foot looks just fine.  She does supinate. Had films of her left foot in January 2021 demonstrating what appears to be a prior chevron osteotomy of the first metatarsal with implant still in place.  Also had an osteotomy of the second metatarsal head with a small screw in place.  There is medial subluxation of the great toe metatarsal phalangeal joint and some early arthritic changes  Follow-Up Instructions: Return Refer to Tara Kennedy.   Orders:  Orders Placed This Encounter  Procedures   Ambulatory referral to Orthopedic Surgery   No orders of the defined types  were placed in this encounter.     Procedures: No procedures performed   Clinical Data: No additional findings.   Subjective: Chief Complaint  Patient presents with   Left Foot - Pain  Patient presents today for left foot pain. She said that she has a history of bunionectomy years ago. She states that her fifth metatarsal is "jutting out". She also has callous on the bottom of her foot that is painful if bare foot. She has had this filed down in the past with Tara Kennedy.    HPI  Review of Systems   Objective: Vital Signs: There were no vitals taken for this visit.  Physical Exam Constitutional:      Appearance: She is well-developed.  Pulmonary:     Effort: Pulmonary effort is normal.  Skin:    General: Skin is warm and dry.  Neurological:     Mental Status: She is alert and oriented to person, place, and time.  Psychiatric:        Behavior: Behavior normal.    Ortho Exam awake alert and oriented x3.  Comfortable sitting.  Exam was limited to the left foot.  She has had prior bunion surgery and surgery to the second metatarsal phalangeal joint.  She has a bunion that with a little bit of redness but absolutely no pain.  She has a small callus over the first metatarsal head but not terribly thick and no pain.  Without her shoe or socks when she bears weight she has significant varus position of the great toe which feels unstable in terms of increased motion medially laterally even dorsally.  Think the problem is the extensor tendon might sublux and cause her to have the varus position of the great toe.  There are no calluses and no particular pain.  Sensory intact  Specialty Comments:  No specialty comments available.  Imaging: No results found.   PMFS History: Patient Active Problem List   Diagnosis Date Noted   Plantar callus 05/18/2020   Pain in left foot 09/23/2019   Chronic mastoiditis of left side 04/08/2019   Otitis media follow-up, not resolved, left  03/11/2019   Absence of mandibular condyle 03/08/2019   Acquired facial deformity 03/08/2019   CSF otorrhea 03/08/2019   Eustachian tube dysfunction, bilateral 03/08/2019   Other specified postprocedural states 03/08/2019   Pituitary adenoma ([facility]) 03/26/2017   Sensorineural hearing loss (SNHL) of right ear with restricted hearing of left ear 03/24/2017   Trismus 03/24/2017   Hypertensive retinopathy of both eyes 04/25/2015   Nuclear sclerotic cataract of both eyes 04/25/2015   Benign meningioma of brain (Tara Kennedy) 12/09/2014   Primary open angle glaucoma of both eyes, mild stage 02/26/2013   Meningioma (Tara Kennedy) 03/04/2012   Glaucoma 03/04/2012   HTN (hypertension), benign 03/04/2012   Hyperlipidemia 03/04/2012   Retinal vein thrombosis, left 02/20/2012   Blepharospasm 01/01/2012   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 01/01/2012   Past Medical History:  Diagnosis Date   Glaucoma 03/04/2012   HTN (hypertension), benign 03/04/2012   Hyperlipidemia 03/04/2012   Meningioma ([place]) 03/04/2012   Dx 7/10; major resection at [facility], Dr Tara Kennedy then [facility]  Dr Tara Kennedy   Retinal vein thrombosis, left 02/20/2012    Family History  Problem Relation Age of Onset   Breast cancer Mother    Breast cancer Sister    Breast cancer Maternal Aunt     Past Surgical History:  Procedure Laterality Date   COLONOSCOPY WITH PROPOFOL N/A 05/07/2018   Procedure: COLONOSCOPY WITH PROPOFOL;  Surgeon: Tara Lobo, MD;  Location: [facility];  Service: Endoscopy;  Laterality: N/A;   Social History   Occupational History   Not on file  Tobacco Use   Smoking status: Never   Smokeless tobacco: Never  Substance and Sexual Activity   Alcohol use: Yes    Alcohol/week: 3.0 standard drinks    Types: 3 Glasses of wine per week   Drug use: Not on file   Sexual activity: Not on file

## 2021-03-22 ENCOUNTER — Ambulatory Visit: Payer: Self-pay

## 2021-03-22 ENCOUNTER — Ambulatory Visit (INDEPENDENT_AMBULATORY_CARE_PROVIDER_SITE_OTHER): Payer: BC Managed Care – PPO | Admitting: Orthopedic Surgery

## 2021-03-22 DIAGNOSIS — M2032 Hallux varus (acquired), left foot: Secondary | ICD-10-CM | POA: Diagnosis not present

## 2021-03-22 DIAGNOSIS — M79672 Pain in left foot: Secondary | ICD-10-CM | POA: Diagnosis not present

## 2021-03-25 ENCOUNTER — Encounter: Payer: Self-pay | Admitting: Orthopedic Surgery

## 2021-03-25 NOTE — Progress Notes (Signed)
Office Visit Note   Patient: Tara Kennedy           Date of Birth: Jun 07, 1960           MRN: [id number] Visit Date: 03/22/2021              Requested by: Garald Balding, MD [address] [place],  [place] [address] PCP: Aretta Nip, MD  Chief Complaint  Patient presents with   Left Foot - Pain      HPI: Patient is a 60 year old woman who was status post bunion surgery and Weil osteotomy of the second metatarsal who has developed progressive hallux varus deformity of the great toe MTP joint.  Patient states she has pain with activities of daily living pain with shoewear.  Assessment & Plan: Visit Diagnoses:  1. Pain in left foot   2. Hallux varus (acquired), left foot     Plan: Discussed that she has 3 options including shoewear modification EHL tendon redirection to correct the deformity versus fusion.  Patient states she would like to proceed with the fusion surgery.  Follow-Up Instructions: Return in about 2 weeks (around 04/05/2021).   Ortho Exam  Patient is alert, oriented, no adenopathy, well-dressed, normal affect, normal respiratory effort. Examination patient has a good dorsalis pedis pulse there are no ulcers.  She has an unstable hallux varus deformity of the great toe.  She is currently wearing extrawide Altra sneakers and still has pain with shoewear.  There is no redness no cellulitis no signs of infection.  Patient has 2 previous dorsal incisions over the great toe and second metatarsal.  Imaging: No results found. No images are attached to the encounter.  Labs: Lab Results  Component Value Date   ESRSEDRATE 1 03/03/2012     No results found for: ALBUMIN, PREALBUMIN, CBC  No results found for: MG No results found for: VD25OH  No results found for: PREALBUMIN CBC EXTENDED Latest Ref Rng & Units 03/03/2012  WBC 3.9 - 10.3 10e3/uL 8.4  RBC 3.70 - 5.45 10e6/uL 4.48  HGB 11.6 - 15.9 g/dL 13.5  HCT 34.8 - 46.6 % 40.7  PLT 145 -  400 10e3/uL 212  NEUTROABS 1.5 - 6.5 10e3/uL 6.6(H)  LYMPHSABS 0.9 - 3.3 10e3/uL 0.7(L)     There is no height or weight on file to calculate BMI.  Orders:  Orders Placed This Encounter  Procedures   XR Foot Complete Left   No orders of the defined types were placed in this encounter.    Procedures: No procedures performed  Clinical Data: No additional findings.  ROS:  All other systems negative, except as noted in the HPI. Review of Systems  Objective: Vital Signs: There were no vitals taken for this visit.  Specialty Comments:  No specialty comments available.  PMFS History: Patient Active Problem List   Diagnosis Date Noted   Plantar callus 05/18/2020   Pain in left foot 09/23/2019   Chronic mastoiditis of left side 04/08/2019   Otitis media follow-up, not resolved, left 03/11/2019   Absence of mandibular condyle 03/08/2019   Acquired facial deformity 03/08/2019   CSF otorrhea 03/08/2019   Eustachian tube dysfunction, bilateral 03/08/2019   Other specified postprocedural states 03/08/2019   Pituitary adenoma ([place]) 03/26/2017   Sensorineural hearing loss (SNHL) of right ear with restricted hearing of left ear 03/24/2017   Trismus 03/24/2017   Hypertensive retinopathy of both eyes 04/25/2015   Nuclear sclerotic cataract of both eyes 04/25/2015  Benign meningioma of brain ([place]) 12/09/2014   Primary open angle glaucoma of both eyes, mild stage 02/26/2013   Meningioma ([place]) 03/04/2012   Glaucoma 03/04/2012   HTN (hypertension), benign 03/04/2012   Hyperlipidemia 03/04/2012   Retinal vein thrombosis, left 02/20/2012   Blepharospasm 01/01/2012   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 01/01/2012   Past Medical History:  Diagnosis Date   Glaucoma 03/04/2012   HTN (hypertension), benign 03/04/2012   Hyperlipidemia 03/04/2012   Meningioma ([place]) 03/04/2012   Dx 7/10; major resection at [place], Dr Ernie Avena then [place]  Dr Leonel Ramsay   Retinal vein thrombosis, left  02/20/2012    Family History  Problem Relation Age of Onset   Breast cancer Mother    Breast cancer Sister    Breast cancer Maternal Aunt     Past Surgical History:  Procedure Laterality Date   COLONOSCOPY WITH PROPOFOL N/A 05/07/2018   Procedure: COLONOSCOPY WITH PROPOFOL;  Surgeon: Ronald Lobo, MD;  Location: [facility];  Service: Endoscopy;  Laterality: N/A;   Social History   Occupational History   Not on file  Tobacco Use   Smoking status: Never   Smokeless tobacco: Never  Substance and Sexual Activity   Alcohol use: Yes    Alcohol/week: 3.0 standard drinks    Types: 3 Glasses of wine per week   Drug use: Not on file   Sexual activity: Not on file

## 2021-03-26 DIAGNOSIS — Z1211 Encounter for screening for malignant neoplasm of colon: Secondary | ICD-10-CM | POA: Diagnosis not present

## 2021-04-13 DIAGNOSIS — H7312 Chronic myringitis, left ear: Secondary | ICD-10-CM | POA: Diagnosis not present

## 2021-04-13 DIAGNOSIS — Z4589 Encounter for adjustment and management of other implanted devices: Secondary | ICD-10-CM | POA: Diagnosis not present

## 2021-04-13 DIAGNOSIS — G9601 Cranial cerebrospinal fluid leak, spontaneous: Secondary | ICD-10-CM | POA: Diagnosis not present

## 2021-04-13 DIAGNOSIS — H90A32 Mixed conductive and sensorineural hearing loss, unilateral, left ear with restricted hearing on the contralateral side: Secondary | ICD-10-CM | POA: Diagnosis not present

## 2021-04-13 DIAGNOSIS — R252 Cramp and spasm: Secondary | ICD-10-CM | POA: Diagnosis not present

## 2021-04-13 DIAGNOSIS — D320 Benign neoplasm of cerebral meninges: Secondary | ICD-10-CM | POA: Diagnosis not present

## 2021-04-13 DIAGNOSIS — H90A21 Sensorineural hearing loss, unilateral, right ear, with restricted hearing on the contralateral side: Secondary | ICD-10-CM | POA: Diagnosis not present

## 2021-04-30 DIAGNOSIS — Z6826 Body mass index (BMI) 26.0-26.9, adult: Secondary | ICD-10-CM | POA: Diagnosis not present

## 2021-04-30 DIAGNOSIS — Z01419 Encounter for gynecological examination (general) (routine) without abnormal findings: Secondary | ICD-10-CM | POA: Diagnosis not present

## 2021-05-15 ENCOUNTER — Other Ambulatory Visit: Payer: Self-pay | Admitting: Family

## 2021-05-15 ENCOUNTER — Encounter (HOSPITAL_BASED_OUTPATIENT_CLINIC_OR_DEPARTMENT_OTHER): Payer: Self-pay | Admitting: Orthopedic Surgery

## 2021-05-15 ENCOUNTER — Other Ambulatory Visit: Payer: Self-pay

## 2021-05-16 ENCOUNTER — Encounter (HOSPITAL_BASED_OUTPATIENT_CLINIC_OR_DEPARTMENT_OTHER)
Admission: RE | Admit: 2021-05-16 | Discharge: 2021-05-16 | Disposition: A | Discharge status: Home / Self Care | Payer: BC Managed Care – PPO | Source: Ambulatory Visit | Attending: Family Medicine | Admitting: Family Medicine

## 2021-05-16 DIAGNOSIS — Z01810 Encounter for preprocedural cardiovascular examination: Secondary | ICD-10-CM | POA: Diagnosis present

## 2021-05-16 NOTE — Progress Notes (Signed)

## 2021-05-21 NOTE — Anesthesia Preprocedure Evaluation (Addendum)
Anesthesia Evaluation  Patient identified by MRN, date of birth, ID band Patient awake    Reviewed: Allergy & Precautions, NPO status , Patient's Chart, lab work & pertinent test results  History of Anesthesia Complications (+) DIFFICULT AIRWAY and history of anesthetic complications (difficulty opening mouth)  Airway Mallampati: III  TM Distance: <3 FB Neck ROM: Full  Mouth opening: Limited Mouth Opening Comment: Records note complete occlusion of right nare   2019 intubation note from [place]: ETT 09/25/17; 0732; Ventilated by mask (1); Fiberoptic intubation; Single-Lumen, Cuffed; 60m; Other (comment); Nasal, Left; 1-Full view of the glottis; 1 insertion attempt; ETCO2 Detector; (sutured at the bend by Dr. ARosana Berger; 09/25/17; [address] Dental  (+) Missing Right facial deformity/drooping:   Pulmonary asthma ,    Pulmonary exam normal breath sounds clear to auscultation       Cardiovascular Exercise Tolerance: Good hypertension, Pt. on medications Normal cardiovascular exam Rhythm:Regular Rate:Normal     Neuro/Psych H/o meningioma Hard of hearing H/o retinal vein thrombosis glaucoma  Neuromuscular disease (right facial paralyis) negative psych ROS   GI/Hepatic negative GI ROS, Neg liver ROS,   Endo/Other  negative endocrine ROS  Renal/GU negative Renal ROS  negative genitourinary   Musculoskeletal negative musculoskeletal ROS (+)   Abdominal   Peds negative pediatric ROS (+)  Hematology negative hematology ROS (+)   Anesthesia Other Findings H/o surgical [place] right [place]  Reproductive/Obstetrics negative OB ROS                          Anesthesia Physical Anesthesia Plan  ASA: 3  Anesthesia Plan: General, Regional and MAC   Post-op Pain Management: Minimal or no pain anticipated   Induction: Intravenous  PONV Risk Score and Plan: 3 and  Midazolam, Treatment may vary due to age or medical condition, Ondansetron and Dexamethasone  Airway Management Planned: LMA  Additional Equipment: None  Intra-op Plan:   Post-operative Plan: Extubation in OR  Informed Consent: I have reviewed the patients History and Physical, chart, labs and discussed the procedure including the risks, benefits and alternatives for the proposed anesthesia with the patient or authorized representative who has indicated his/her understanding and acceptance.     Dental advisory given  Plan Discussed with: Anesthesiologist  Anesthesia Plan Comments: (Patient unable to swallow tylenol preop. Popliteal/saphenous block. Propofol gtt. GA/LMA as the backup plan. If LMA will not fit, will plan on oral vs. Nasal fiberoptic intubation given history from 2019 notes. Left nare is the only usable nare. CNorton Blizzard MD  )    Anesthesia Quick Evaluation

## 2021-05-22 ENCOUNTER — Encounter (HOSPITAL_BASED_OUTPATIENT_CLINIC_OR_DEPARTMENT_OTHER): Payer: Self-pay | Admitting: Orthopedic Surgery

## 2021-05-22 ENCOUNTER — Encounter (HOSPITAL_BASED_OUTPATIENT_CLINIC_OR_DEPARTMENT_OTHER)
Admission: RE | Disposition: A | Discharge status: Home / Self Care | Payer: Self-pay | Source: Home / Self Care | Attending: Orthopedic Surgery

## 2021-05-22 ENCOUNTER — Ambulatory Visit (HOSPITAL_BASED_OUTPATIENT_CLINIC_OR_DEPARTMENT_OTHER)
Admission: RE | Admit: 2021-05-22 | Discharge: 2021-05-22 | Disposition: A | Discharge status: Home / Self Care | Payer: BC Managed Care – PPO | Attending: Orthopedic Surgery | Admitting: Orthopedic Surgery

## 2021-05-22 ENCOUNTER — Ambulatory Visit (HOSPITAL_BASED_OUTPATIENT_CLINIC_OR_DEPARTMENT_OTHER): Payer: BC Managed Care – PPO | Admitting: Anesthesiology

## 2021-05-22 ENCOUNTER — Other Ambulatory Visit: Payer: Self-pay

## 2021-05-22 ENCOUNTER — Ambulatory Visit (HOSPITAL_BASED_OUTPATIENT_CLINIC_OR_DEPARTMENT_OTHER): Payer: BC Managed Care – PPO

## 2021-05-22 DIAGNOSIS — Z9889 Other specified postprocedural states: Secondary | ICD-10-CM | POA: Diagnosis not present

## 2021-05-22 DIAGNOSIS — Z79899 Other long term (current) drug therapy: Secondary | ICD-10-CM | POA: Diagnosis not present

## 2021-05-22 DIAGNOSIS — M2032 Hallux varus (acquired), left foot: Secondary | ICD-10-CM

## 2021-05-22 DIAGNOSIS — I10 Essential (primary) hypertension: Secondary | ICD-10-CM | POA: Insufficient documentation

## 2021-05-22 DIAGNOSIS — Z969 Presence of functional implant, unspecified: Secondary | ICD-10-CM | POA: Diagnosis not present

## 2021-05-22 HISTORY — PX: ARTHRODESIS METATARSALPHALANGEAL JOINT (MTPJ): SHX6566

## 2021-05-22 HISTORY — DX: Unspecified hearing loss, unspecified ear: H91.90

## 2021-05-22 HISTORY — DX: Other complications of anesthesia, initial encounter: T88.59XA

## 2021-05-22 HISTORY — DX: Unspecified asthma, uncomplicated: J45.909

## 2021-05-22 SURGERY — FUSION, JOINT, GREAT TOE
Anesthesia: Monitor Anesthesia Care | Site: Toe | Laterality: Left

## 2021-05-22 MED ORDER — BUPIVACAINE HCL (PF) 0.5 % IJ SOLN
INTRAMUSCULAR | Status: AC
  Filled 2021-05-22: qty 30

## 2021-05-22 MED ORDER — FENTANYL CITRATE (PF) 100 MCG/2ML IJ SOLN
INTRAMUSCULAR | Status: AC
  Filled 2021-05-22: qty 2

## 2021-05-22 MED ORDER — OXYCODONE HCL 5 MG/5ML PO SOLN: 5.0000 mg | Freq: Once | ORAL | Status: DC | PRN

## 2021-05-22 MED ORDER — ACETAMINOPHEN 500 MG PO TABS: 1000.0000 mg | ORAL_TABLET | Freq: Once | ORAL | Status: DC

## 2021-05-22 MED ORDER — HYDROCODONE-ACETAMINOPHEN 7.5-325 MG/15ML PO SOLN: 15.0000 mL | ORAL | 0 refills | Status: DC | PRN

## 2021-05-22 MED ORDER — FENTANYL CITRATE (PF) 100 MCG/2ML IJ SOLN
100.0000 ug | Freq: Once | INTRAMUSCULAR | Status: AC
  Administered 2021-05-22: 07:00:00 50 ug via INTRAVENOUS

## 2021-05-22 MED ORDER — 0.9 % SODIUM CHLORIDE (POUR BTL) OPTIME
TOPICAL | Status: DC | PRN
  Administered 2021-05-22: 08:00:00 250 mL

## 2021-05-22 MED ORDER — ONDANSETRON HCL 4 MG/2ML IJ SOLN
INTRAMUSCULAR | Status: DC | PRN
  Administered 2021-05-22: 4 mg via INTRAVENOUS

## 2021-05-22 MED ORDER — MIDAZOLAM HCL 2 MG/2ML IJ SOLN
INTRAMUSCULAR | Status: AC
  Filled 2021-05-22: qty 2

## 2021-05-22 MED ORDER — DROPERIDOL 2.5 MG/ML IJ SOLN: 0.6250 mg | Freq: Once | INTRAMUSCULAR | Status: DC | PRN

## 2021-05-22 MED ORDER — ROPIVACAINE HCL 5 MG/ML IJ SOLN
INTRAMUSCULAR | Status: DC | PRN
  Administered 2021-05-22: 40 mL via PERINEURAL

## 2021-05-22 MED ORDER — OXYCODONE HCL 5 MG PO TABS: 5.0000 mg | ORAL_TABLET | Freq: Once | ORAL | Status: DC | PRN

## 2021-05-22 MED ORDER — ACETAMINOPHEN 500 MG PO TABS
ORAL_TABLET | ORAL | Status: AC
  Filled 2021-05-22: qty 2

## 2021-05-22 MED ORDER — PROPOFOL 500 MG/50ML IV EMUL
INTRAVENOUS | Status: DC | PRN
  Administered 2021-05-22: 100 ug/kg/min via INTRAVENOUS

## 2021-05-22 MED ORDER — MIDAZOLAM HCL 5 MG/5ML IJ SOLN
INTRAMUSCULAR | Status: DC | PRN
  Administered 2021-05-22: 1 mg via INTRAVENOUS

## 2021-05-22 MED ORDER — FENTANYL CITRATE (PF) 100 MCG/2ML IJ SOLN: 25.0000 ug | INTRAMUSCULAR | Status: DC | PRN

## 2021-05-22 MED ORDER — BUPIVACAINE HCL (PF) 0.25 % IJ SOLN
INTRAMUSCULAR | Status: AC
  Filled 2021-05-22: qty 30

## 2021-05-22 MED ORDER — MIDAZOLAM HCL 2 MG/2ML IJ SOLN
2.0000 mg | Freq: Once | INTRAMUSCULAR | Status: AC
  Administered 2021-05-22: 07:00:00 1 mg via INTRAVENOUS

## 2021-05-22 MED ORDER — LACTATED RINGERS IV SOLN: INTRAVENOUS | Status: DC

## 2021-05-22 MED ORDER — CEFAZOLIN SODIUM-DEXTROSE 2-4 GM/100ML-% IV SOLN
INTRAVENOUS | Status: AC
  Filled 2021-05-22: qty 100

## 2021-05-22 MED ORDER — CEFAZOLIN SODIUM-DEXTROSE 2-4 GM/100ML-% IV SOLN
2.0000 g | INTRAVENOUS | Status: AC
  Administered 2021-05-22: 07:00:00 2 g via INTRAVENOUS

## 2021-05-22 MED ORDER — ONDANSETRON HCL 4 MG/2ML IJ SOLN: 4.0000 mg | Freq: Once | INTRAMUSCULAR | Status: DC | PRN

## 2021-05-22 SURGICAL SUPPLY — 50 items
BIT DRILL SOLID 2.0 X 110MM (DRILL) IMPLANT
BLADE OSC/SAG .038X5.5 CUT EDG (BLADE) ×3 IMPLANT
BLADE SURG 15 STRL LF DISP TIS (BLADE) ×2 IMPLANT
BLADE SURG 15 STRL SS (BLADE) ×6
BNDG CMPR 9X4 STRL LF SNTH (GAUZE/BANDAGES/DRESSINGS) ×1
BNDG COHESIVE 4X5 TAN ST LF (GAUZE/BANDAGES/DRESSINGS) ×3 IMPLANT
BNDG ESMARK 4X9 LF (GAUZE/BANDAGES/DRESSINGS) ×3 IMPLANT
BNDG GAUZE ELAST 4 BULKY (GAUZE/BANDAGES/DRESSINGS) ×3 IMPLANT
COVER BACK TABLE 60X90IN (DRAPES) ×3 IMPLANT
DECANTER SPIKE VIAL GLASS SM (MISCELLANEOUS) IMPLANT
DRAPE EXTREMITY T 121X128X90 (DISPOSABLE) ×3 IMPLANT
DRAPE IMP U-DRAPE 54X76 (DRAPES) ×3 IMPLANT
DRAPE OEC MINIVIEW 54X84 (DRAPES) ×3 IMPLANT
DRAPE U-SHAPE 47X51 STRL (DRAPES) ×3 IMPLANT
DRILL SOLID 2.0 X 110MM (DRILL) ×3
DRSG EMULSION OIL 3X3 NADH (GAUZE/BANDAGES/DRESSINGS) ×3 IMPLANT
DRSG PAD ABDOMINAL 8X10 ST (GAUZE/BANDAGES/DRESSINGS) ×2 IMPLANT
DURAPREP 26ML APPLICATOR (WOUND CARE) ×3 IMPLANT
ELECT REM PT RETURN 9FT ADLT (ELECTROSURGICAL) ×3
ELECTRODE REM PT RTRN 9FT ADLT (ELECTROSURGICAL) ×1 IMPLANT
GAUZE 4X4 16PLY ~~LOC~~+RFID DBL (SPONGE) IMPLANT
GAUZE SPONGE 4X4 12PLY STRL (GAUZE/BANDAGES/DRESSINGS) ×3 IMPLANT
GLOVE SURG ORTHO LTX SZ9 (GLOVE) ×1 IMPLANT
GLOVE SURG UNDER POLY LF SZ9 (GLOVE) ×5 IMPLANT
GOWN STRL REUS W/ TWL LRG LVL3 (GOWN DISPOSABLE) ×1 IMPLANT
GOWN STRL REUS W/TWL LRG LVL3 (GOWN DISPOSABLE) ×3
GOWN STRL REUS W/TWL XL LVL3 (GOWN DISPOSABLE) ×3 IMPLANT
K-WIRE SMOOTH 1.6X150MM (WIRE) ×3
KWIRE SMOOTH 1.6X150MM (WIRE) IMPLANT
NDL HYPO 25X1 1.5 SAFETY (NEEDLE) IMPLANT
NEEDLE HYPO 25X1 1.5 SAFETY (NEEDLE) IMPLANT
NS IRRIG 1000ML POUR BTL (IV SOLUTION) ×3 IMPLANT
PACK BASIN DAY SURGERY FS (CUSTOM PROCEDURE TRAY) ×3 IMPLANT
PAD CAST 4YDX4 CTTN HI CHSV (CAST SUPPLIES) IMPLANT
PADDING CAST COTTON 4X4 STRL (CAST SUPPLIES)
PENCIL SMOKE EVACUATOR (MISCELLANEOUS) ×3 IMPLANT
PLATE LAPIDUS LEFT 10 SHORT (Plate) ×2 IMPLANT
SCREW LOCK PLATE R3 2.7X16 (Screw) ×6 IMPLANT
SCREW NON LOCK PLATE 2.7X16M (Screw) ×2 IMPLANT
SCREW NON LOCKING 2.7X18 (Screw) ×2 IMPLANT
SPONGE T-LAP 18X18 ~~LOC~~+RFID (SPONGE) ×3 IMPLANT
STOCKINETTE 6  STRL (DRAPES) ×3
STOCKINETTE 6 STRL (DRAPES) ×1 IMPLANT
SUT ETHILON 2 0 FSLX (SUTURE) ×3 IMPLANT
SUT ETHILON 3 0 FSL (SUTURE) IMPLANT
SUT VIC AB 2-0 CT1 27 (SUTURE) ×3
SUT VIC AB 2-0 CT1 TAPERPNT 27 (SUTURE) ×1 IMPLANT
SYR BULB EAR ULCER 3OZ GRN STR (SYRINGE) ×3 IMPLANT
SYR CONTROL 10ML LL (SYRINGE) IMPLANT
TOWEL GREEN STERILE FF (TOWEL DISPOSABLE) ×6 IMPLANT

## 2021-05-22 NOTE — Discharge Instructions (Signed)

## 2021-05-22 NOTE — Anesthesia Postprocedure Evaluation (Signed)
Anesthesia Post Note  Patient: Tara Kennedy  Procedure(s) Performed: LEFT GREAT TOE METATARSALPHALANGEAL JOINT (MTPJ) FUSION (Left: Toe)     Patient location during evaluation: PACU Anesthesia Type: Regional and MAC Level of consciousness: awake and alert Pain management: pain level controlled Vital Signs Assessment: post-procedure vital signs reviewed and stable Respiratory status: spontaneous breathing and respiratory function stable Cardiovascular status: stable Postop Assessment: no apparent nausea or vomiting Anesthetic complications: no   No notable events documented.  Last Vitals:  Vitals:   05/22/21 0841 05/22/21 0845  BP:  106/77  Pulse: (!) 55 (!) 58  Resp: 15 12  Temp:    SpO2: 100% 97%    Last Pain:  Vitals:   05/22/21 0841  TempSrc:   PainSc: Asleep                 Merlinda Frederick

## 2021-05-22 NOTE — Anesthesia Procedure Notes (Signed)
Anesthesia Regional Block: Adductor canal block   Pre-Anesthetic Checklist: , timeout performed,  Correct Patient, Correct Site, Correct Laterality,  Correct Procedure, Correct Position, site marked,  Risks and benefits discussed,  Surgical consent,  Pre-op evaluation,  At surgeon's request and post-op pain management  Laterality: Left  Prep: chloraprep       Needles:  Injection technique: Single-shot  Needle Type: Echogenic Stimulator Needle     Needle Length: 10cm  Needle Gauge: 20     Additional Needles:   Procedures:,,,, ultrasound used (permanent image in chart),,    Narrative:  Start time: 05/22/2021 6:55 AM End time: 05/22/2021 7:00 AM Injection made incrementally with aspirations every 5 mL.  Performed by: Personally  Anesthesiologist: Merlinda Frederick, MD  Additional Notes: Functioning IV was confirmed and monitors were applied.  Sterile prep and drape,hand hygiene and sterile gloves were used. Ultrasound guidance: relevant anatomy identified, needle position confirmed, local anesthetic spread visualized around nerve(s)., vascular puncture avoided. Negative aspiration and negative test dose prior to incremental administration of local anesthetic. The patient tolerated the procedure well.

## 2021-05-22 NOTE — Progress Notes (Addendum)
Assisted Dr. Elgie Congo with left, ultrasound guided, popliteal and saphenous block. Side rails up, monitors on throughout procedure. See vital signs in flow sheet. Tolerated Procedure well.

## 2021-05-22 NOTE — Interval H&P Note (Signed)
History and Physical Interval Note:  05/22/2021 7:22 AM  Tara Kennedy  has presented today for surgery, with the diagnosis of Hallux Varus Left Foot.  The various methods of treatment have been discussed with the patient and family. After consideration of risks, benefits and other options for treatment, the patient has consented to  Procedure(s): LEFT GREAT TOE METATARSALPHALANGEAL JOINT (MTPJ) FUSION (Left) as a surgical intervention.  The patient's history has been reviewed, patient examined, no change in status, stable for surgery.  I have reviewed the patient's chart and labs.  Questions were answered to the patient's satisfaction.     [name]

## 2021-05-22 NOTE — Progress Notes (Signed)
Pt post surgery in phase 2 (discharge) area ready for d/c. Husband instructed on best method for dressing the pt using walker and instructed on how to use recliner when ready to get dressed. While husband was helping pt dress she attempted to slide forward while the leg of the chair was still elevated at which time the chair closed and she slid to the floor on her buttocks. Nurse came in and assisted pt to her feet with assistance of husband and she used walker to get in wheelchair. Pt was assessed and offered evaluation at ED if injured. No visible injury. She stated she was not injured or in pain and declined any further evaluation. MD notified.

## 2021-05-22 NOTE — Op Note (Signed)
05/22/2021  8:38 AM  PATIENT:  Tara Kennedy    PRE-OPERATIVE DIAGNOSIS:  Hallux Varus Left Foot with deep retained hardware  POST-OPERATIVE DIAGNOSIS:  Same  PROCEDURE:  LEFT GREAT TOE METATARSALPHALANGEAL JOINT (MTPJ) FUSION Removal of deep retained hardware. C arm fluoroscopy to verify reduction.  SURGEON:  [name], MD  PHYSICIAN ASSISTANT:None ANESTHESIA:   General  PREOPERATIVE INDICATIONS:  [name] is a  60 y.o. female with a diagnosis of Hallux Varus Left Foot who failed conservative measures and elected for surgical management.    The risks benefits and alternatives were discussed with the patient preoperatively including but not limited to the risks of infection, bleeding, nerve injury, cardiopulmonary complications, the need for revision surgery, among others, and the patient was willing to proceed.  OPERATIVE IMPLANTS: [employer] MTP fusion plate.  @ENCIMAGES @  OPERATIVE FINDINGS: C-arm fluoroscopy verified alignment of the fusion.  OPERATIVE PROCEDURE: Patient was brought the operating room after undergoing a regional anesthetic.  After adequate levels anesthesia were obtained patient's left lower extremity was prepped using DuraPrep draped into a sterile field a timeout was called.  A medial longitudinal incision was used this was carried down to bone sharply.  A subperiosteal elevator was used to elevate the extensor tendons and scar tissue off the MTP joint.  Patient had a deep retained screw overhanging bone was resected and the screw was removed.  There was also a deep retained buried wire the wire was also removed without complications.  The cup and cone reamers were used to prepare the MTP joint.  A guidewire was placed down the shaft of the first metatarsal a 17 mm cup reamer is used to debride the metatarsal head back to bleeding viable subchondral bone.  A guidewire was then placed in the base of the proximal phalanx and the cone  reamer was used 17 mm to debride the joint back to bleeding viable subchondral bone.  The plate was placed dorsally provisionally fixed proximally the toe was aligned and a distal compression screw was placed the proximal compression screw was compressed and alignment was checked.  2 locking screws were then placed proximally and another locking screw placed distally.  C-arm fluoroscopy verified alignment the wound was irrigated with normal saline.  Incision was closed using 2-0 nylon a sterile dressing was applied.   DISCHARGE PLANNING:  Antibiotic duration: Preoperative antibiotics  Weightbearing: Ideally nonweightbearing on the left  Pain medication: Prescription for hydrocodone elixir  Dressing care/ Wound VAC: Dry dressing change as needed  Ambulatory devices: Patient has a walker at home  Discharge to: Home.  Follow-up: In the office 1 week post operative.

## 2021-05-22 NOTE — Anesthesia Procedure Notes (Signed)
Anesthesia Regional Block: Popliteal block   Pre-Anesthetic Checklist: , timeout performed,  Correct Patient, Correct Site, Correct Laterality,  Correct Procedure, Correct Position, site marked,  Risks and benefits discussed,  Surgical consent,  Pre-op evaluation,  At surgeon's request and post-op pain management  Laterality: Left  Prep: chloraprep       Needles:  Injection technique: Single-shot  Needle Type: Echogenic Stimulator Needle          Additional Needles:   Procedures:,,,, ultrasound used (permanent image in chart),,    Narrative:  Start time: 05/22/2021 6:55 AM End time: 05/22/2021 7:00 AM Injection made incrementally with aspirations every 5 mL.  Performed by: Personally  Anesthesiologist: Merlinda Frederick, MD  Additional Notes: A functioning IV was confirmed and monitors were applied.  Sterile prep and drape, hand hygiene and sterile gloves were used.  Negative aspiration and test dose prior to incremental administration of local anesthetic. The patient tolerated the procedure well.Ultrasound  guidance: relevant anatomy identified, needle position confirmed, local anesthetic spread visualized around nerve(s), vascular puncture avoided.  Image printed for medical record.

## 2021-05-22 NOTE — H&P (Signed)
Tara Kennedy is an 60 y.o. female.   Chief Complaint: pain and deformity left great toe HPI: Patient is a 60 year old woman who was status post bunion surgery and Weil osteotomy of the second metatarsal who has developed progressive hallux varus deformity of the great toe MTP joint.  Patient states she has pain with activities of daily living pain with shoewear.  Past Medical History:  Diagnosis Date   Asthma    Complication of anesthesia    patient has difficulty opening mouth wide   Glaucoma 03/04/2012   HOH (hard of hearing)    uses hearing aids   HTN (hypertension), benign 03/04/2012   Hyperlipidemia 03/04/2012   Meningioma ([place]) 03/04/2012   Dx 7/10; major resection at [place], Dr Ernie Avena then [place]  Dr Leonel Ramsay   Retinal vein thrombosis, left 02/20/2012    Past Surgical History:  Procedure Laterality Date   COLONOSCOPY WITH PROPOFOL N/A 05/07/2018   Procedure: COLONOSCOPY WITH PROPOFOL;  Surgeon: Ronald Lobo, MD;  [facility];  Service: Endoscopy;  Laterality: N/A;   HEMANGIOMA EXCISION  2012   At [facility]-Major resection   MANDIBLE SURGERY Bilateral    Masseter muscle    Family History  Problem Relation Age of Onset   Breast cancer Mother    Breast cancer Sister    Breast cancer Maternal Aunt    Social History:  reports that she has never smoked. She has never used smokeless tobacco. She reports current alcohol use of about 3.0 standard drinks per week. She reports that she does not use drugs.  Allergies:  Allergies  Allergen Reactions   Latex Other (See Comments) and Swelling    Gloves - lips swell with dentist latex gloves Other Reaction: swollen lips from dentist     Medications Prior to Admission  Medication Sig Dispense Refill   atorvastatin (LIPITOR) 20 MG tablet Take 20 mg by mouth daily.      Calcium-Vitamin D-Vitamin K 650-12.5-40 MG-MCG-MCG CHEW Chew 1 each by mouth 3 (three) times daily.     dorzolamide-timolol (COSOPT)  22.3-6.8 MG/ML ophthalmic solution Place 1 drop into both eyes 2 (two) times daily.   11   lisinopril (PRINIVIL,ZESTRIL) 10 MG tablet Take 10 mg by mouth daily.       No results found for this or any previous visit (from the past 48 hour(s)). No results found.  Review of Systems  All other systems reviewed and are negative.  Height 5\' 4"  (1.626 m), weight 68.9 kg. Physical Exam  Patient is alert, oriented, no adenopathy, well-dressed, normal affect, normal respiratory effort. Examination patient has a good dorsalis pedis pulse there are no ulcers.  She has an unstable hallux varus deformity of the great toe.  She is currently wearing extrawide Altra sneakers and still has pain with shoewear.  There is no redness no cellulitis no signs of infection.  Patient has 2 previous dorsal incisions over the great toe and second metatarsal. Assessment/Plan 1. Pain in left foot   2. Hallux varus (acquired), left foot       Plan: Discussed that she has 3 options including shoewear modification EHL tendon redirection to correct the deformity versus fusion.  Patient states she would like to proceed with the fusion surgery.  [name], MD 05/22/2021, 6:34 AM

## 2021-05-22 NOTE — Transfer of Care (Signed)
Immediate Anesthesia Transfer of Care Note  Patient: Tara Kennedy  Procedure(s) Performed: LEFT GREAT TOE METATARSALPHALANGEAL JOINT (MTPJ) FUSION (Left: Toe)  Patient Location: PACU  Anesthesia Type:MAC combined with regional for post-op pain  Level of Consciousness: awake, alert  and oriented  Airway & Oxygen Therapy: Patient Spontanous Breathing and Patient connected to face mask oxygen  Post-op Assessment: Report given to RN and Post -op Vital signs reviewed and stable  Post vital signs: Reviewed and stable  Last Vitals:  Vitals Value Taken Time  BP    Temp    Pulse 54 05/22/21 0839  Resp    SpO2 100 % 05/22/21 0839  Vitals shown include unvalidated device data.  Last Pain:  Vitals:   05/22/21 0636  TempSrc: Oral  PainSc: 0-No pain      Patients Stated Pain Goal: 4 (03/10/11 1975)  Complications: No notable events documented.

## 2021-05-23 ENCOUNTER — Encounter (HOSPITAL_BASED_OUTPATIENT_CLINIC_OR_DEPARTMENT_OTHER): Payer: Self-pay | Admitting: Orthopedic Surgery

## 2021-05-31 ENCOUNTER — Ambulatory Visit (INDEPENDENT_AMBULATORY_CARE_PROVIDER_SITE_OTHER): Payer: BC Managed Care – PPO | Admitting: Orthopedic Surgery

## 2021-05-31 DIAGNOSIS — M2032 Hallux varus (acquired), left foot: Secondary | ICD-10-CM

## 2021-06-01 ENCOUNTER — Encounter: Payer: Self-pay | Admitting: Orthopedic Surgery

## 2021-06-01 NOTE — Progress Notes (Signed)
Office Visit Note   Patient: Tara Kennedy           Date of Birth: [birth year]           MRN: [id number] Visit Date: 05/31/2021              Requested by: Aretta Nip, [place],  [place] [address] PCP: Aretta Nip, MD  Chief Complaint  Patient presents with   Left Foot - Routine Post Op    05/22/21 left great toe MTP fusion       HPI: Patient is a 60 year old woman who presents 1 week status post left great toe fusion secondary to hallux varus collapse from previous bunion surgery.  Patient is currently nonweightbearing on a knee scooter.  Assessment & Plan: Visit Diagnoses:  1. Hallux varus (acquired), left foot     Plan: Patient will start Dial soap cleansing dry dressing changes continue protected weightbearing.  2 view radiographs of the left foot at follow-up  Follow-Up Instructions: Return in about 1 week (around 06/07/2021).   Ortho Exam  Patient is alert, oriented, no adenopathy, well-dressed, normal affect, normal respiratory effort. Examination incision is well approximated there is no redness no cellulitis no signs of infection.  Imaging: No results found. No images are attached to the encounter.  Labs: Lab Results  Component Value Date   ESRSEDRATE 1 03/03/2012     No results found for: ALBUMIN, PREALBUMIN, CBC  No results found for: MG No results found for: VD25OH  No results found for: PREALBUMIN CBC EXTENDED Latest Ref Rng & Units 03/03/2012  WBC 3.9 - 10.3 10e3/uL 8.4  RBC 3.70 - 5.45 10e6/uL 4.48  HGB 11.6 - 15.9 g/dL 13.5  HCT 34.8 - 46.6 % 40.7  PLT 145 - 400 10e3/uL 212  NEUTROABS 1.5 - 6.5 10e3/uL 6.6(H)  LYMPHSABS 0.9 - 3.3 10e3/uL 0.7(L)     There is no height or weight on file to calculate BMI.  Orders:  No orders of the defined types were placed in this encounter.  No orders of the defined types were placed in this encounter.    Procedures: No procedures  performed  Clinical Data: No additional findings.  ROS:  All other systems negative, except as noted in the HPI. Review of Systems  Objective: Vital Signs: There were no vitals taken for this visit.  Specialty Comments:  No specialty comments available.  PMFS History: Patient Active Problem List   Diagnosis Date Noted   Hallux varus (acquired), left foot    Presence of retained hardware    Plantar callus 05/18/2020   Pain in left foot 09/23/2019   Chronic mastoiditis of left side 04/08/2019   Otitis media follow-up, not resolved, left 03/11/2019   Absence of mandibular condyle 03/08/2019   Acquired facial deformity 03/08/2019   CSF otorrhea 03/08/2019   Eustachian tube dysfunction, bilateral 03/08/2019   Other specified postprocedural states 03/08/2019   Pituitary adenoma ([place]) 03/26/2017   Sensorineural hearing loss (SNHL) of right ear with restricted hearing of left ear 03/24/2017   Trismus 03/24/2017   Hypertensive retinopathy of both eyes 04/25/2015   Nuclear sclerotic cataract of both eyes 04/25/2015   Benign meningioma of brain ([place]) 12/09/2014   Primary open angle glaucoma of both eyes, mild stage 02/26/2013   Meningioma ([place]) 03/04/2012   Glaucoma 03/04/2012   HTN (hypertension), benign 03/04/2012   Hyperlipidemia 03/04/2012   Retinal vein thrombosis, left 02/20/2012   Blepharospasm 01/01/2012  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 01/01/2012   Past Medical History:  Diagnosis Date   Asthma    Complication of anesthesia    patient has difficulty opening mouth wide   Glaucoma 03/04/2012   HOH (hard of hearing)    uses hearing aids   HTN (hypertension), benign 03/04/2012   Hyperlipidemia 03/04/2012   Meningioma ([place]) 03/04/2012   Dx 7/10; major resection at [place], Dr Ernie Avena then [place]  Dr Leonel Ramsay   Retinal vein thrombosis, left 02/20/2012    Family History  Problem Relation Age of Onset   Breast cancer Mother    Breast cancer Sister    Breast cancer  Maternal Aunt     Past Surgical History:  Procedure Laterality Date   ARTHRODESIS METATARSALPHALANGEAL JOINT (MTPJ) Left 05/22/2021   Procedure: LEFT GREAT TOE METATARSALPHALANGEAL JOINT (MTPJ) FUSION;  Surgeon: [name], MD;  Location: [facility];  Service: Orthopedics;  Laterality: Left;   COLONOSCOPY WITH PROPOFOL N/A 05/07/2018   Procedure: COLONOSCOPY WITH PROPOFOL;  Surgeon: Ronald Lobo, MD;  [facility];  Service: Endoscopy;  Laterality: N/A;   HEMANGIOMA EXCISION  2012   At [facility]-Major resection   MANDIBLE SURGERY Bilateral    Masseter muscle   Social History   Occupational History   Not on file  Tobacco Use   Smoking status: Never   Smokeless tobacco: Never  Substance and Sexual Activity   Alcohol use: Yes    Alcohol/week: 3.0 standard drinks    Types: 3 Glasses of wine per week    Comment: social   Drug use: Never   Sexual activity: Not on file

## 2021-06-08 ENCOUNTER — Ambulatory Visit (INDEPENDENT_AMBULATORY_CARE_PROVIDER_SITE_OTHER): Payer: BC Managed Care – PPO | Admitting: Family

## 2021-06-08 DIAGNOSIS — L84 Corns and callosities: Secondary | ICD-10-CM

## 2021-06-08 DIAGNOSIS — M2032 Hallux varus (acquired), left foot: Secondary | ICD-10-CM

## 2021-06-12 ENCOUNTER — Encounter: Payer: Self-pay | Admitting: Family

## 2021-06-12 NOTE — Progress Notes (Signed)
Office Visit Note   Patient: Tara Kennedy           Date of Birth: 1961/05/27           MRN: [id number] Visit Date: 06/08/2021              Requested by: Aretta Nip, [place],  [place] [address] PCP: Aretta Nip, MD  Chief Complaint  Patient presents with   Left Foot - Routine Post Op    05/22/21 left GT MTP fusion       HPI: Patient is a 61 year old woman who presents status post left great toe fusion secondary to hallux varus collapse from previous bunion surgery.  Patient is currently nonweightbearing with crutches or walker.  Assessment & Plan: Visit Diagnoses:  1. Hallux varus (acquired), left foot   2. Plantar callus     Plan: sutures harvested today. continue Dial soap cleansing dry dressing changes. Will continue cam walker and nonweight bearing. Anticipate advancing weight bearing at follow up.  Follow-Up Instructions: Return in about 2 weeks (around 06/22/2021).   Ortho Exam  Patient is alert, oriented, no adenopathy, well-dressed, normal affect, normal respiratory effort. Examination incision is well approximated and well healed. Sutures in place. there is no redness no cellulitis no signs of infection.  Imaging: No results found. No images are attached to the encounter.  Labs: Lab Results  Component Value Date   ESRSEDRATE 1 03/03/2012     No results found for: ALBUMIN, PREALBUMIN, CBC  No results found for: MG No results found for: VD25OH  No results found for: PREALBUMIN CBC EXTENDED Latest Ref Rng & Units 03/03/2012  WBC 3.9 - 10.3 10e3/uL 8.4  RBC 3.70 - 5.45 10e6/uL 4.48  HGB 11.6 - 15.9 g/dL 13.5  HCT 34.8 - 46.6 % 40.7  PLT 145 - 400 10e3/uL 212  NEUTROABS 1.5 - 6.5 10e3/uL 6.6(H)  LYMPHSABS 0.9 - 3.3 10e3/uL 0.7(L)     There is no height or weight on file to calculate BMI.  Orders:  No orders of the defined types were placed in this encounter.  No orders of the defined types were  placed in this encounter.    Procedures: No procedures performed  Clinical Data: No additional findings.  ROS:  All other systems negative, except as noted in the HPI. Review of Systems  Objective: Vital Signs: There were no vitals taken for this visit.  Specialty Comments:  No specialty comments available.  PMFS History: Patient Active Problem List   Diagnosis Date Noted   Hallux varus (acquired), left foot    Presence of retained hardware    Plantar callus 05/18/2020   Pain in left foot 09/23/2019   Chronic mastoiditis of left side 04/08/2019   Otitis media follow-up, not resolved, left 03/11/2019   Absence of mandibular condyle 03/08/2019   Acquired facial deformity 03/08/2019   CSF otorrhea 03/08/2019   Eustachian tube dysfunction, bilateral 03/08/2019   Other specified postprocedural states 03/08/2019   Pituitary adenoma ([place]) 03/26/2017   Sensorineural hearing loss (SNHL) of right ear with restricted hearing of left ear 03/24/2017   Trismus 03/24/2017   Hypertensive retinopathy of both eyes 04/25/2015   Nuclear sclerotic cataract of both eyes 04/25/2015   Benign meningioma of brain ([place]) 12/09/2014   Primary open angle glaucoma of both eyes, mild stage 02/26/2013   Meningioma ([place]) 03/04/2012   Glaucoma 03/04/2012   HTN (hypertension), benign 03/04/2012   Hyperlipidemia 03/04/2012  Retinal vein thrombosis, left 02/20/2012   Blepharospasm 01/01/2012   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 01/01/2012   Past Medical History:  Diagnosis Date   Asthma    Complication of anesthesia    patient has difficulty opening mouth wide   Glaucoma 03/04/2012   HOH (hard of hearing)    uses hearing aids   HTN (hypertension), benign 03/04/2012   Hyperlipidemia 03/04/2012   Meningioma ([employer]) 03/04/2012   Dx 7/10; major resection at [place], Dr Ernie Avena then [place]  Dr Leonel Ramsay   Retinal vein thrombosis, left 02/20/2012    Family History  Problem Relation Age of Onset    Breast cancer Mother    Breast cancer Sister    Breast cancer Maternal Aunt     Past Surgical History:  Procedure Laterality Date   ARTHRODESIS METATARSALPHALANGEAL JOINT (MTPJ) Left 05/22/2021   Procedure: LEFT GREAT TOE METATARSALPHALANGEAL JOINT (MTPJ) FUSION;  Surgeon: [name], MD;  Location: [facility];  Service: Orthopedics;  Laterality: Left;   COLONOSCOPY WITH PROPOFOL N/A 05/07/2018   Procedure: COLONOSCOPY WITH PROPOFOL;  Surgeon: Ronald Lobo, MD;  [facility];  Service: Endoscopy;  Laterality: N/A;   HEMANGIOMA EXCISION  2012   At [facility]-Major resection   MANDIBLE SURGERY Bilateral    Masseter muscle   Social History   Occupational History   Not on file  Tobacco Use   Smoking status: Never   Smokeless tobacco: Never  Substance and Sexual Activity   Alcohol use: Yes    Alcohol/week: 3.0 standard drinks    Types: 3 Glasses of wine per week    Comment: social   Drug use: Never   Sexual activity: Not on file

## 2021-06-22 ENCOUNTER — Other Ambulatory Visit: Payer: Self-pay

## 2021-06-22 ENCOUNTER — Ambulatory Visit (INDEPENDENT_AMBULATORY_CARE_PROVIDER_SITE_OTHER): Payer: BC Managed Care – PPO

## 2021-06-22 ENCOUNTER — Ambulatory Visit (INDEPENDENT_AMBULATORY_CARE_PROVIDER_SITE_OTHER): Payer: BC Managed Care – PPO | Admitting: Family

## 2021-06-22 DIAGNOSIS — M2032 Hallux varus (acquired), left foot: Secondary | ICD-10-CM | POA: Diagnosis not present

## 2021-06-22 NOTE — Progress Notes (Signed)
Office Visit Note   Patient: Tara Kennedy           Date of Birth: [birth year]           MRN: [id number] Visit Date: 06/22/2021              Requested by: Aretta Nip, [place],  [place] [address] PCP: Aretta Nip, MD  Chief Complaint  Patient presents with   Left Foot - Routine Post Op    05/22/21 left GT MTP fusion       HPI: Patient is a 61 year old woman who presents status post left great toe fusion secondary to hallux varus collapse from previous bunion surgery.  Patient is currently nonweightbearing walker.  Assessment & Plan: Visit Diagnoses:  No diagnosis found.   Plan: Maintenance weightbearing in her cam walker. plan to follow-up in 2 more weeks  Follow-Up Instructions: No follow-ups on file.   Ortho Exam  Patient is alert, oriented, no adenopathy, well-dressed, normal affect, normal respiratory effort. Examination incision is well healed. there is no redness no cellulitis no signs of infection.  Imaging: No results found. No images are attached to the encounter.  Labs: Lab Results  Component Value Date   ESRSEDRATE 1 03/03/2012     No results found for: ALBUMIN, PREALBUMIN, CBC  No results found for: MG No results found for: VD25OH  No results found for: PREALBUMIN CBC EXTENDED Latest Ref Rng & Units 03/03/2012  WBC 3.9 - 10.3 10e3/uL 8.4  RBC 3.70 - 5.45 10e6/uL 4.48  HGB 11.6 - 15.9 g/dL 13.5  HCT 34.8 - 46.6 % 40.7  PLT 145 - 400 10e3/uL 212  NEUTROABS 1.5 - 6.5 10e3/uL 6.6(H)  LYMPHSABS 0.9 - 3.3 10e3/uL 0.7(L)     There is no height or weight on file to calculate BMI.  Orders:  No orders of the defined types were placed in this encounter.  No orders of the defined types were placed in this encounter.    Procedures: No procedures performed  Clinical Data: No additional findings.  ROS:  All other systems negative, except as noted in the HPI. Review of Systems  Constitutional:   Negative for chills and fever.   Objective: Vital Signs: There were no vitals taken for this visit.  Specialty Comments:  No specialty comments available.  PMFS History: Patient Active Problem List   Diagnosis Date Noted   Hallux varus (acquired), left foot    Presence of retained hardware    Plantar callus 05/18/2020   Pain in left foot 09/23/2019   Chronic mastoiditis of left side 04/08/2019   Otitis media follow-up, not resolved, left 03/11/2019   Absence of mandibular condyle 03/08/2019   Acquired facial deformity 03/08/2019   CSF otorrhea 03/08/2019   Eustachian tube dysfunction, bilateral 03/08/2019   Other specified postprocedural states 03/08/2019   Pituitary adenoma ([place]) 03/26/2017   Sensorineural hearing loss (SNHL) of right ear with restricted hearing of left ear 03/24/2017   Trismus 03/24/2017   Hypertensive retinopathy of both eyes 04/25/2015   Nuclear sclerotic cataract of both eyes 04/25/2015   Benign meningioma of brain ([place]) 12/09/2014   Primary open angle glaucoma of both eyes, mild stage 02/26/2013   Meningioma ([place]) 03/04/2012   Glaucoma 03/04/2012   HTN (hypertension), benign 03/04/2012   Hyperlipidemia 03/04/2012   Retinal vein thrombosis, left 02/20/2012   Blepharospasm 01/01/2012   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 01/01/2012   Past Medical History:  Diagnosis  Date   Asthma    Complication of anesthesia    patient has difficulty opening mouth wide   Glaucoma 03/04/2012   HOH (hard of hearing)    uses hearing aids   HTN (hypertension), benign 03/04/2012   Hyperlipidemia 03/04/2012   Meningioma ([place]) 03/04/2012   Dx 7/10; major resection at [facility], Dr Ernie Avena then RT  Dr Leonel Ramsay   Retinal vein thrombosis, left 02/20/2012    Family History  Problem Relation Age of Onset   Breast cancer Mother    Breast cancer Sister    Breast cancer Maternal Aunt     Past Surgical History:  Procedure Laterality Date   ARTHRODESIS  METATARSALPHALANGEAL JOINT (MTPJ) Left 05/22/2021   Procedure: LEFT GREAT TOE METATARSALPHALANGEAL JOINT (MTPJ) FUSION;  Surgeon: [name], MD;  Location: [facility];  Service: Orthopedics;  Laterality: Left;   COLONOSCOPY WITH PROPOFOL N/A 05/07/2018   Procedure: COLONOSCOPY WITH PROPOFOL;  Surgeon: Ronald Lobo, MD;  [facility];  Service: Endoscopy;  Laterality: N/A;   HEMANGIOMA EXCISION  2012   At [facility]-Major resection   MANDIBLE SURGERY Bilateral    Masseter muscle   Social History   Occupational History   Not on file  Tobacco Use   Smoking status: Never   Smokeless tobacco: Never  Substance and Sexual Activity   Alcohol use: Yes    Alcohol/week: 3.0 standard drinks    Types: 3 Glasses of wine per week    Comment: social   Drug use: Never   Sexual activity: Not on file

## 2021-07-12 ENCOUNTER — Ambulatory Visit (INDEPENDENT_AMBULATORY_CARE_PROVIDER_SITE_OTHER): Payer: BC Managed Care – PPO | Admitting: Orthopedic Surgery

## 2021-07-12 ENCOUNTER — Other Ambulatory Visit: Payer: Self-pay

## 2021-07-12 ENCOUNTER — Encounter: Payer: BC Managed Care – PPO | Admitting: Orthopedic Surgery

## 2021-07-12 DIAGNOSIS — M2032 Hallux varus (acquired), left foot: Secondary | ICD-10-CM

## 2021-07-14 ENCOUNTER — Encounter: Payer: Self-pay | Admitting: Orthopedic Surgery

## 2021-07-14 NOTE — Progress Notes (Signed)
Office Visit Note   Patient: Tara Kennedy           Date of Birth: November 12, 1960           MRN: [id number] Visit Date: 07/12/2021              Requested by: Aretta Nip, [place],  [place] [address] PCP: Aretta Nip, MD  Chief Complaint  Patient presents with   Left Foot - Routine Post Op    05/22/21 left GT MTP fusion      HPI: Patient is a 61 year old woman who is about 6 weeks status post left great toe MTP fusion for hallux varus deformity.  Assessment & Plan: Visit Diagnoses:  1. Hallux varus (acquired), left foot     Plan: Patient will continue to increase her activities as tolerated no restrictions.  Follow-Up Instructions: Return if symptoms worsen or fail to improve.   Ortho Exam  Patient is alert, oriented, no adenopathy, well-dressed, normal affect, normal respiratory effort. Examination patient is currently ambulating in regular shoewear she states she occasionally has pain there is no deformity to the toe the fusion site is stable there is no cellulitis.  Imaging: No results found. No images are attached to the encounter.  Labs: Lab Results  Component Value Date   ESRSEDRATE 1 03/03/2012     No results found for: ALBUMIN, PREALBUMIN, CBC  No results found for: MG No results found for: VD25OH  No results found for: PREALBUMIN CBC EXTENDED Latest Ref Rng & Units 03/03/2012  WBC 3.9 - 10.3 10e3/uL 8.4  RBC 3.70 - 5.45 10e6/uL 4.48  HGB 11.6 - 15.9 g/dL 13.5  HCT 34.8 - 46.6 % 40.7  PLT 145 - 400 10e3/uL 212  NEUTROABS 1.5 - 6.5 10e3/uL 6.6(H)  LYMPHSABS 0.9 - 3.3 10e3/uL 0.7(L)     There is no height or weight on file to calculate BMI.  Orders:  No orders of the defined types were placed in this encounter.  No orders of the defined types were placed in this encounter.    Procedures: No procedures performed  Clinical Data: No additional findings.  ROS:  All other systems negative,  except as noted in the HPI. Review of Systems  Objective: Vital Signs: There were no vitals taken for this visit.  Specialty Comments:  No specialty comments available.  PMFS History: Patient Active Problem List   Diagnosis Date Noted   Hallux varus (acquired), left foot    Presence of retained hardware    Plantar callus 05/18/2020   Pain in left foot 09/23/2019   Chronic mastoiditis of left side 04/08/2019   Otitis media follow-up, not resolved, left 03/11/2019   Absence of mandibular condyle 03/08/2019   Acquired facial deformity 03/08/2019   CSF otorrhea 03/08/2019   Eustachian tube dysfunction, bilateral 03/08/2019   Other specified postprocedural states 03/08/2019   Pituitary adenoma ([place]) 03/26/2017   Sensorineural hearing loss (SNHL) of right ear with restricted hearing of left ear 03/24/2017   Trismus 03/24/2017   Hypertensive retinopathy of both eyes 04/25/2015   Nuclear sclerotic cataract of both eyes 04/25/2015   Benign meningioma of brain ([place]) 12/09/2014   Primary open angle glaucoma of both eyes, mild stage 02/26/2013   Meningioma ([place]) 03/04/2012   Glaucoma 03/04/2012   HTN (hypertension), benign 03/04/2012   Hyperlipidemia 03/04/2012   Retinal vein thrombosis, left 02/20/2012   Blepharospasm 01/01/2012   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 01/01/2012  Past Medical History:  Diagnosis Date   Asthma    Complication of anesthesia    patient has difficulty opening mouth wide   Glaucoma 03/04/2012   HOH (hard of hearing)    uses hearing aids   HTN (hypertension), benign 03/04/2012   Hyperlipidemia 03/04/2012   Meningioma ([place]) 03/04/2012   Dx 7/10; major resection at [facility], Dr Ernie Avena then RT  Dr Leonel Ramsay   Retinal vein thrombosis, left 02/20/2012    Family History  Problem Relation Age of Onset   Breast cancer Mother    Breast cancer Sister    Breast cancer Maternal Aunt     Past Surgical History:  Procedure Laterality Date   ARTHRODESIS  METATARSALPHALANGEAL JOINT (MTPJ) Left 05/22/2021   Procedure: LEFT GREAT TOE METATARSALPHALANGEAL JOINT (MTPJ) FUSION;  Surgeon: [name], MD;  Location: [facility];  Service: Orthopedics;  Laterality: Left;   COLONOSCOPY WITH PROPOFOL N/A 05/07/2018   Procedure: COLONOSCOPY WITH PROPOFOL;  Surgeon: Ronald Lobo, MD;  [facility];  Service: Endoscopy;  Laterality: N/A;   HEMANGIOMA EXCISION  2012   At [facility]-Major resection   MANDIBLE SURGERY Bilateral    Masseter muscle   Social History   Occupational History   Not on file  Tobacco Use   Smoking status: Never   Smokeless tobacco: Never  Substance and Sexual Activity   Alcohol use: Yes    Alcohol/week: 3.0 standard drinks    Types: 3 Glasses of wine per week    Comment: social   Drug use: Never   Sexual activity: Not on file

## 2021-07-20 ENCOUNTER — Encounter: Payer: Self-pay | Admitting: Family

## 2021-08-01 ENCOUNTER — Ambulatory Visit (INDEPENDENT_AMBULATORY_CARE_PROVIDER_SITE_OTHER): Payer: BC Managed Care – PPO | Admitting: Orthopaedic Surgery

## 2021-08-01 ENCOUNTER — Encounter: Payer: Self-pay | Admitting: Orthopaedic Surgery

## 2021-08-01 ENCOUNTER — Ambulatory Visit: Payer: Self-pay

## 2021-08-01 VITALS — Ht 65.0 in | Wt 155.0 lb

## 2021-08-01 DIAGNOSIS — G8929 Other chronic pain: Secondary | ICD-10-CM

## 2021-08-01 DIAGNOSIS — M25562 Pain in left knee: Secondary | ICD-10-CM | POA: Diagnosis not present

## 2021-08-01 MED ORDER — LIDOCAINE HCL 1 % IJ SOLN
5.0000 mL | INTRAMUSCULAR | Status: AC | PRN
  Administered 2021-08-01: 5 mL

## 2021-08-01 MED ORDER — METHYLPREDNISOLONE ACETATE 40 MG/ML IJ SUSP
80.0000 mg | INTRAMUSCULAR | Status: AC | PRN
  Administered 2021-08-01: 80 mg via INTRA_ARTICULAR

## 2021-08-01 NOTE — Progress Notes (Signed)
? ?Office Visit Note ?  ?Patient: Tara Kennedy           ?Date of Birth: July 25, 1960           ?MRN: [id number] ?Visit Date: 08/01/2021 ?             ?Requested by: Aretta Nip, MD ?[address] ?[place],  [place] [address] ?PCP: Aretta Nip, MD ? ? ?Assessment & Plan: ?Visit Diagnoses: Left knee pain left knee pain ? ?Plan: Patient is a very pleasant 61 year old woman who is a little over 2 months status post left first MTP arthrodesis by Dr. Sharol Given.  She started developing left medial knee pain after the surgery.  She is not sure if this is because she was using a knee scooter or changing how she moved about.  She noticed in the last couple weeks when she was released to transition to a regular shoe it is gotten worse.  She denies any previous injury.  She denies any locking catching or instability x-rays were fairly normal she does have a little bit of medial compartment arthritis but no acute osseous changes.  She her tenderness seems to be over the medial femoral condyle we will go forward with an injection today follow-up if she does not improve ? ?Follow-Up Instructions: No follow-ups on file.  ? ?Orders:  ?No orders of the defined types were placed in this encounter. ? ?No orders of the defined types were placed in this encounter. ? ? ? ? Procedures: ?Large Joint Inj: L knee on 08/01/2021 3:11 PM ?Indications: pain and diagnostic evaluation ?Details: 25 G 1.5 in needle, anteromedial approach ? ?Arthrogram: No ? ?Medications: 80 mg methylPREDNISolone acetate 40 MG/ML; 5 mL lidocaine 1 % ?Outcome: tolerated well, no immediate complications ?Procedure, treatment alternatives, risks and benefits explained, specific risks discussed. Consent was given by the patient.  ? ? ? ? ?Clinical Data: ?No additional findings. ? ? ?Subjective: ?Chief Complaint  ?Patient presents with  ?? Left Knee - Pain  ?Patient presents today for left knee pain. She states that she developed pain in her left knee  while rehabbing from a left foot surgery. She has a constant ache and it worse at the end of her work day. The pain is located medially. Stairs are difficult. She takes over the counter pain medicine.  ? ? ? ?Review of Systems  ?All other systems reviewed and are negative. ? ? ?Objective: ?Vital Signs: There were no vitals taken for this visit. ? ?Physical Exam ?Constitutional:   ?   Appearance: Normal appearance.  ?Pulmonary:  ?   Effort: Pulmonary effort is normal.  ?Neurological:  ?   Mental Status: She is alert.  ?Psychiatric:     ?   Mood and Affect: Mood normal.     ?   Behavior: Behavior normal.  ? ? ?Ortho Exam ?Examination of her left knee she has no erythema she has no effusion.  No soft tissue swelling.  She does have some pain focally over the medial femoral condyle.  No lateral or medial joint line tenderness.  She does have increase in pain with terminal extension and with flexion.  She has good varus valgus stability compartments are soft and nontender distally CMS is intact ?Specialty Comments:  ?No specialty comments available. ? ?Imaging: ?No results found. ? ? ?PMFS History: ?Patient Active Problem List  ? Diagnosis Date Noted  ?? Hallux varus (acquired), left foot   ?? Presence of retained hardware   ??  Plantar callus 05/18/2020  ?? Pain in left foot 09/23/2019  ?? Chronic mastoiditis of left side 04/08/2019  ?? Otitis media follow-up, not resolved, left 03/11/2019  ?? Absence of mandibular condyle 03/08/2019  ?? Acquired facial deformity 03/08/2019  ?? CSF otorrhea 03/08/2019  ?? Eustachian tube dysfunction, bilateral 03/08/2019  ?? Other specified postprocedural states 03/08/2019  ?? Pituitary adenoma ([place]) 03/26/2017  ?? Sensorineural hearing loss (SNHL) of right ear with restricted hearing of left ear 03/24/2017  ?? Trismus 03/24/2017  ?? Hypertensive retinopathy of both eyes 04/25/2015  ?? Nuclear sclerotic cataract of both eyes 04/25/2015  ?? Benign meningioma of brain ([place]) 12/09/2014  ??  Primary open angle glaucoma of both eyes, mild stage 02/26/2013  ?? Meningioma ([place]) 03/04/2012  ?? Glaucoma 03/04/2012  ?? HTN (hypertension), benign 03/04/2012  ?? Hyperlipidemia 03/04/2012  ?? Retinal vein thrombosis, left 02/20/2012  ?? Blepharospasm 01/01/2012  ?? 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 ?Past Medical History:  ?Diagnosis Date  ?? Asthma   ?? Complication of anesthesia   ? patient has difficulty opening mouth wide  ?? Glaucoma 03/04/2012  ?? HOH (hard of hearing)   ? uses hearing aids  ?? HTN (hypertension), benign 03/04/2012  ?? Hyperlipidemia 03/04/2012  ?? Meningioma ([place]) 03/04/2012  ? Dx 7/10; major resection at [facility], Dr Ernie Avena then RT  Dr Leonel Ramsay  ?? Retinal vein thrombosis, left 02/20/2012  ?  ?Family History  ?Problem Relation Age of Onset  ?? Breast cancer Mother   ?? Breast cancer Sister   ?? Breast cancer Maternal Aunt   ?  ?Past Surgical History:  ?Procedure Laterality Date  ?? ARTHRODESIS METATARSALPHALANGEAL JOINT (MTPJ) Left 05/22/2021  ? Procedure: LEFT GREAT TOE METATARSALPHALANGEAL JOINT (MTPJ) FUSION;  Surgeon: [name], MD;  Location: [facility];  Service: Orthopedics;  Laterality: Left;  ?? COLONOSCOPY WITH PROPOFOL N/A 05/07/2018  ? Procedure: COLONOSCOPY WITH PROPOFOL;  Surgeon: Ronald Lobo, MD;  [facility];  Service: Endoscopy;  Laterality: N/A;  ?? HEMANGIOMA EXCISION  2012  ? At [facility] resection  ?? MANDIBLE SURGERY Bilateral   ? Masseter muscle  ? ?Social History  ? ?Occupational History  ?? Not on file  ?Tobacco Use  ?? Smoking status: Never  ?? Smokeless tobacco: Never  ?Substance and Sexual Activity  ?? Alcohol use: Yes  ?  Alcohol/week: 3.0 standard drinks  ?  Types: 3 Glasses of wine per week  ?  Comment: social  ?? Drug use: Never  ?? Sexual activity: Not on file  ? ? ? ? ? ? ?

## 2021-08-14 ENCOUNTER — Telehealth: Payer: Self-pay | Admitting: Orthopaedic Surgery

## 2021-08-14 NOTE — Telephone Encounter (Signed)
Please call.

## 2021-08-14 NOTE — Telephone Encounter (Signed)
Pt was instructed to call a couple weeks after injection for an update. Pt states injections made it feel better but not 100%. Pt states she is asking for a call back for further instructions. She is unsure if she need to make another appt or wait for doctor to call with next steps. Please call pt at [phone]. ?

## 2021-08-15 ENCOUNTER — Other Ambulatory Visit: Payer: Self-pay | Admitting: Physician Assistant

## 2021-08-15 DIAGNOSIS — G8929 Other chronic pain: Secondary | ICD-10-CM

## 2021-08-15 NOTE — Telephone Encounter (Signed)
I spoke with Tara Kennedy today.  She did get some relief from the injection but is still having pain.  This is with weightbearing.  This has been going on now since December.  Pain is focally in the medial side of her knee.  She also has trouble straightening her knee which produces significant amount of pain given the length of time and concerns for her knee beginning to lock up I recommended an MRI with follow-up afterwards with Dr. Durward Fortes.  I will place the order today.  She is in agreement with the plan ?

## 2021-08-15 NOTE — Telephone Encounter (Signed)
Patient called stating that she was sorry she missed you but she is now off work and she will be able to take your call mary ann  ?

## 2021-09-12 ENCOUNTER — Encounter: Payer: Self-pay | Admitting: Orthopaedic Surgery

## 2021-09-12 ENCOUNTER — Ambulatory Visit (INDEPENDENT_AMBULATORY_CARE_PROVIDER_SITE_OTHER): Payer: BC Managed Care – PPO | Admitting: Orthopaedic Surgery

## 2021-09-12 DIAGNOSIS — M25562 Pain in left knee: Secondary | ICD-10-CM

## 2021-09-12 NOTE — Progress Notes (Signed)
? ?Office Visit Note ?  ?Patient: Tara Kennedy           ?Date of Birth: [birth year]           ?MRN: [id number] ?Visit Date: 09/12/2021 ?             ?Requested by: Aretta Nip, MD ?[address] ?[place],  [place] [address] ?PCP: Aretta Nip, MD ? ? ?Assessment & Plan: ?Visit Diagnoses:  ?1. Acute pain of left knee   ? ? ?Plan: MRI scan of the left knee demonstrated deep fissuring of the weightbearing area without full-thickness defect medially.  No chondral defects of the patellofemoral joint or lateral compartment.  There was a small joint effusion.  I think what was really causing her pain is an insufficiency subchondral fracture of the medial femoral condyle with approximately 1 cm fracture line with minimal subchondral collapse.  There was moderate surrounding marrow edema.  There was some fluid in the pes anserine bursa on the medial aspect.  Pain is localized along the medial femur in the area of the insufficiency fracture but she is presently not limping and definitely feeling better than even a month ago.  I thoroughly certainly think this explains her pain.  There was no effusion knee was not hot warm or red and there was no pain over the pes anserine bursa.  She has been using gels and over-the-counter medicine and I would not do anything different at this point because she is so much better I will just plan to see her back I think she felt very comfortable knowing that there was some pathology in the area that should improve with time ? ?Follow-Up Instructions: Return if symptoms worsen or fail to improve.  ? ?Orders:  ?No orders of the defined types were placed in this encounter. ? ?No orders of the defined types were placed in this encounter. ? ? ? ? Procedures: ?No procedures performed ? ? ?Clinical Data: ?No additional findings. ? ? ?Subjective: ?Chief Complaint  ?Patient presents with  ? Left Knee - Follow-up  ?  MRI review  ?Patient presents today for follow up on her  left knee. She had an MRI and is here today for those results. ? ?HPI ? ?Review of Systems ? ? ?Objective: ?Vital Signs: There were no vitals taken for this visit. ? ?Physical Exam ?Constitutional:   ?   Appearance: She is well-developed.  ?Pulmonary:  ?   Effort: Pulmonary effort is normal.  ?Skin: ?   General: Skin is warm and dry.  ?Neurological:  ?   Mental Status: She is alert and oriented to person, place, and time.  ?Psychiatric:     ?   Behavior: Behavior normal.  ? ? ?Ortho Exam awake alert and oriented x3.  Comfortable sitting.  Left knee exam with some tenderness over the medial femoral condyle near the joint line.  The knee was not hot warm or red.  No effusion.  There was a bit of swelling over the pes anserine bursa but no pain.  Full extension flexed over 100 degrees without instability.  No popliteal pain or mass.  No patella crepitation or pain laterally ?Specialty Comments:  ?No specialty comments available. ? ?Imaging: ?No results found. ? ? ?PMFS History: ?Patient Active Problem List  ? Diagnosis Date Noted  ? Pain in left knee 08/01/2021  ? Hallux varus (acquired), left foot   ? Presence of retained hardware   ? Plantar callus 05/18/2020  ?  Pain in left foot 09/23/2019  ? Chronic mastoiditis of left side 04/08/2019  ? Otitis media follow-up, not resolved, left 03/11/2019  ? Absence of mandibular condyle 03/08/2019  ? Acquired facial deformity 03/08/2019  ? CSF otorrhea 03/08/2019  ? Eustachian tube dysfunction, bilateral 03/08/2019  ? Other specified postprocedural states 03/08/2019  ? Pituitary adenoma ([place]) 03/26/2017  ? Sensorineural hearing loss (SNHL) of right ear with restricted hearing of left ear 03/24/2017  ? Trismus 03/24/2017  ? Hypertensive retinopathy of both eyes 04/25/2015  ? Nuclear sclerotic cataract of both eyes 04/25/2015  ? Benign meningioma of brain ([place]) 12/09/2014  ? Primary open angle glaucoma of both eyes, mild stage 02/26/2013  ? Meningioma ([place]) 03/04/2012  ? Glaucoma  03/04/2012  ? HTN (hypertension), benign 03/04/2012  ? Hyperlipidemia 03/04/2012  ? Retinal vein thrombosis, left 02/20/2012  ? Blepharospasm 01/01/2012  ? 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 ?Past Medical History:  ?Diagnosis Date  ? Asthma   ? Complication of anesthesia   ? patient has difficulty opening mouth wide  ? Glaucoma 03/04/2012  ? HOH (hard of hearing)   ? uses hearing aids  ? HTN (hypertension), benign 03/04/2012  ? Hyperlipidemia 03/04/2012  ? Meningioma ([place]) 03/04/2012  ? Dx 7/10; major resection at [facility], Dr Ernie Avena then RT  Dr Leonel Ramsay  ? Retinal vein thrombosis, left 02/20/2012  ?  ?Family History  ?Problem Relation Age of Onset  ? Breast cancer Mother   ? Breast cancer Sister   ? Breast cancer Maternal Aunt   ?  ?Past Surgical History:  ?Procedure Laterality Date  ? ARTHRODESIS METATARSALPHALANGEAL JOINT (MTPJ) Left 05/22/2021  ? Procedure: LEFT GREAT TOE METATARSALPHALANGEAL JOINT (MTPJ) FUSION;  Surgeon: [name], MD;  Location: [facility];  Service: Orthopedics;  Laterality: Left;  ? COLONOSCOPY WITH PROPOFOL N/A 05/07/2018  ? Procedure: COLONOSCOPY WITH PROPOFOL;  Surgeon: Ronald Lobo, MD;  [facility];  Service: Endoscopy;  Laterality: N/A;  ? HEMANGIOMA EXCISION  2012  ? At [facility] resection  ? MANDIBLE SURGERY Bilateral   ? Masseter muscle  ? ?Social History  ? ?Occupational History  ? Not on file  ?Tobacco Use  ? Smoking status: Never  ? Smokeless tobacco: Never  ?Substance and Sexual Activity  ? Alcohol use: Yes  ?  Alcohol/week: 3.0 standard drinks  ?  Types: 3 Glasses of wine per week  ?  Comment: social  ? Drug use: Never  ? Sexual activity: Not on file  ? ? ? ? ? ? ?

## 2021-10-23 ENCOUNTER — Ambulatory Visit (INDEPENDENT_AMBULATORY_CARE_PROVIDER_SITE_OTHER): Payer: BC Managed Care – PPO | Admitting: Orthopaedic Surgery

## 2021-10-23 ENCOUNTER — Ambulatory Visit (INDEPENDENT_AMBULATORY_CARE_PROVIDER_SITE_OTHER): Payer: BC Managed Care – PPO

## 2021-10-23 ENCOUNTER — Encounter: Payer: Self-pay | Admitting: Orthopaedic Surgery

## 2021-10-23 DIAGNOSIS — M25572 Pain in left ankle and joints of left foot: Secondary | ICD-10-CM | POA: Diagnosis not present

## 2021-10-23 DIAGNOSIS — S93402A Sprain of unspecified ligament of left ankle, initial encounter: Secondary | ICD-10-CM | POA: Diagnosis not present

## 2021-10-23 NOTE — Progress Notes (Signed)
Office Visit Note   Patient: Tara Kennedy           Date of Birth: [birth year]           MRN: [id number] Visit Date: 10/23/2021              Requested by: Aretta Nip, [place],  [place] [address] PCP: Aretta Nip, MD   Assessment & Plan: Visit Diagnoses: Left ankle pain  Plan: 61 year old woman with a remote history of spraining her left ankle when she was in college.  She says she got out of bed a few weeks ago and began having pain in her left ankle.  She says it is both medial lateral side of the ankle and her heel.  No previous ankle surgeries she has been taking Advil for pain.  On examination her pain is most consistent over the lateral ligament complex the ATFL.  She does have some mild soft tissue swelling there as well.  We will place her in an ASO brace see how she does over the course of the next 4 weeks  Follow-Up Instructions: No follow-ups on file.   Orders:  No orders of the defined types were placed in this encounter.  No orders of the defined types were placed in this encounter.     Procedures: No procedures performed   Clinical Data: No additional findings.   Subjective: Chief Complaint  Patient presents with   Left Ankle - Follow-up    Patient presents today for left ankle pain. She states that pain was noticed around 2-3 weeks ago when she stepped out of her bed the wrong way. She states that she has been having increased pain on the left and right side of her ankle along with her heel. She denies having any previous surgeries on her left ankle prior to this injury. OTC advil is being used at this time to help manage pain.  Review of Systems  All other systems reviewed and are negative.   Objective: Vital Signs: There were no vitals taken for this visit.  Physical Exam Constitutional:      Appearance: Normal appearance.  Musculoskeletal:        General: Normal range of motion.  Skin:    General:  Skin is warm and dry.  Neurological:     Mental Status: She is alert.    Ortho Exam Examination of her left ankle mild soft tissue swelling over the lateral ankle.  No ecchymosis.  She has mild to no pain with dorsiflexion plantarflexion of the ankle no pain with motion of the subtalar joint.  She has good strength with dorsiflexion plantarflexion eversion and inversion.  No tenderness over the peroneal tendons Specialty Comments:  No specialty comments available.  Imaging: No results found.   PMFS History: Patient Active Problem List   Diagnosis Date Noted   Pain in left knee 08/01/2021   Hallux varus (acquired), left foot    Presence of retained hardware    Plantar callus 05/18/2020   Pain in left foot 09/23/2019   Chronic mastoiditis of left side 04/08/2019   Otitis media follow-up, not resolved, left 03/11/2019   Absence of mandibular condyle 03/08/2019   Acquired facial deformity 03/08/2019   CSF otorrhea 03/08/2019   Eustachian tube dysfunction, bilateral 03/08/2019   Other specified postprocedural states 03/08/2019   Pituitary adenoma ([place]) 03/26/2017   Sensorineural hearing loss (SNHL) of right ear with restricted hearing of left ear 03/24/2017  Trismus 03/24/2017   Hypertensive retinopathy of both eyes 04/25/2015   Nuclear sclerotic cataract of both eyes 04/25/2015   Benign meningioma of brain ([place]) 12/09/2014   Primary open angle glaucoma of both eyes, mild stage 02/26/2013   Meningioma ([place]) 03/04/2012   Glaucoma 03/04/2012   HTN (hypertension), benign 03/04/2012   Hyperlipidemia 03/04/2012   Retinal vein thrombosis, left 02/20/2012   Blepharospasm 01/01/2012   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 01/01/2012   Past Medical History:  Diagnosis Date   Asthma    Complication of anesthesia    patient has difficulty opening mouth wide   Glaucoma 03/04/2012   HOH (hard of hearing)    uses hearing aids   HTN (hypertension), benign 03/04/2012   Hyperlipidemia  03/04/2012   Meningioma ([place]) 03/04/2012   Dx 7/10; major resection at [facility], Dr Ernie Avena then RT  Dr Leonel Ramsay   Retinal vein thrombosis, left 02/20/2012    Family History  Problem Relation Age of Onset   Breast cancer Mother    Breast cancer Sister    Breast cancer Maternal Aunt     Past Surgical History:  Procedure Laterality Date   ARTHRODESIS METATARSALPHALANGEAL JOINT (MTPJ) Left 05/22/2021   Procedure: LEFT GREAT TOE METATARSALPHALANGEAL JOINT (MTPJ) FUSION;  Surgeon: [name], MD;  Location: [facility];  Service: Orthopedics;  Laterality: Left;   COLONOSCOPY WITH PROPOFOL N/A 05/07/2018   Procedure: COLONOSCOPY WITH PROPOFOL;  Surgeon: Ronald Lobo, MD;  [facility];  Service: Endoscopy;  Laterality: N/A;   HEMANGIOMA EXCISION  2012   At [facility]-Major resection   MANDIBLE SURGERY Bilateral    Masseter muscle   Social History   Occupational History   Not on file  Tobacco Use   Smoking status: Never   Smokeless tobacco: Never  Substance and Sexual Activity   Alcohol use: Yes    Alcohol/week: 3.0 standard drinks    Types: 3 Glasses of wine per week    Comment: social   Drug use: Never   Sexual activity: Not on file

## 2021-12-17 IMAGING — MG MM DIGITAL SCREENING BILAT W/ TOMO AND CAD
8 series · 8 of 24 positions shown · non-contrast
Comparison: Previous exam(s).

CLINICAL DATA: Screening.

EXAM:
DIGITAL SCREENING BILATERAL MAMMOGRAM WITH TOMOSYNTHESIS AND CAD
TECHNIQUE: Bilateral screening digital craniocaudal and mediolateral oblique
mammograms were obtained. Bilateral screening digital breast
tomosynthesis was performed. The images were evaluated with
computer-aided detection.

[L MLO synth-2D]
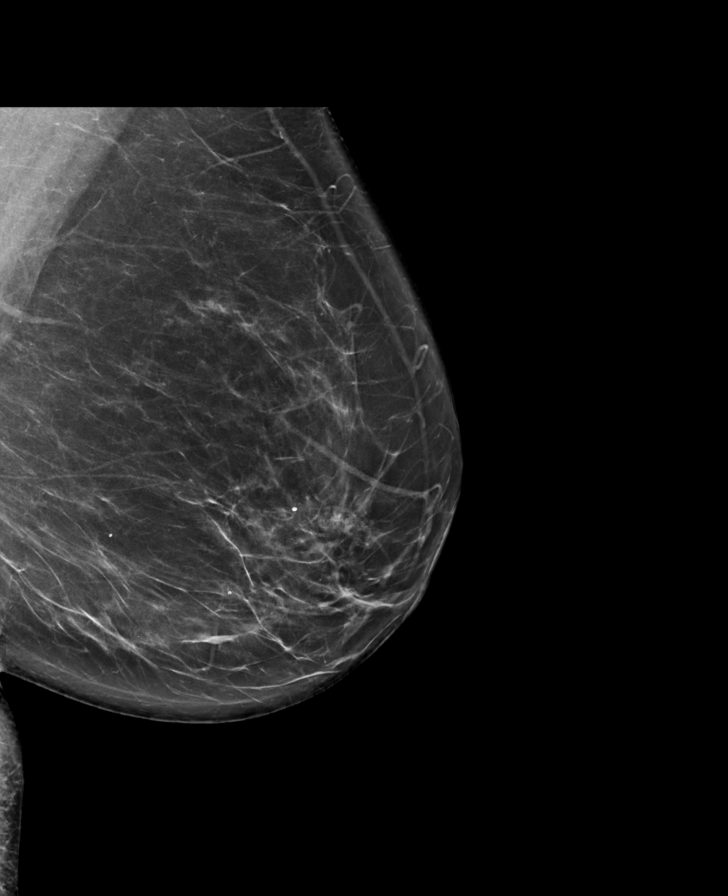

[R MLO synth-2D]
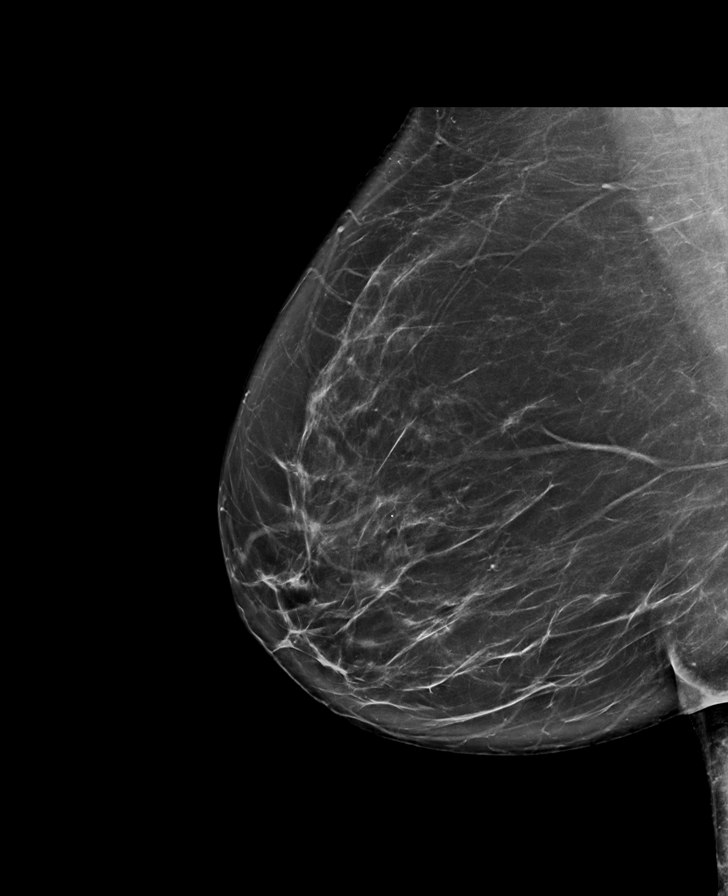

[R CC synth-2D]
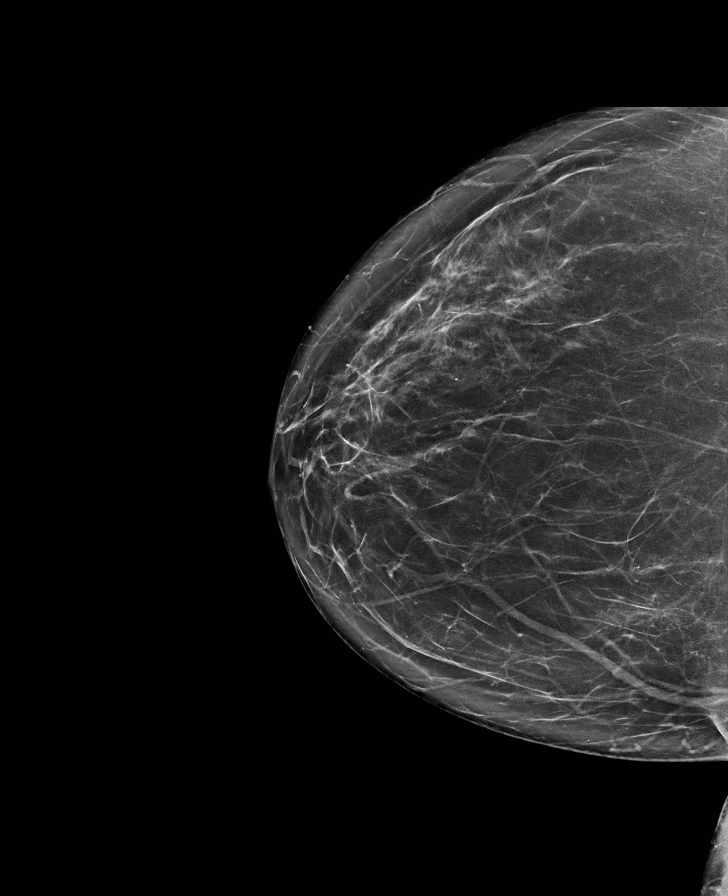

[L CC synth-2D]
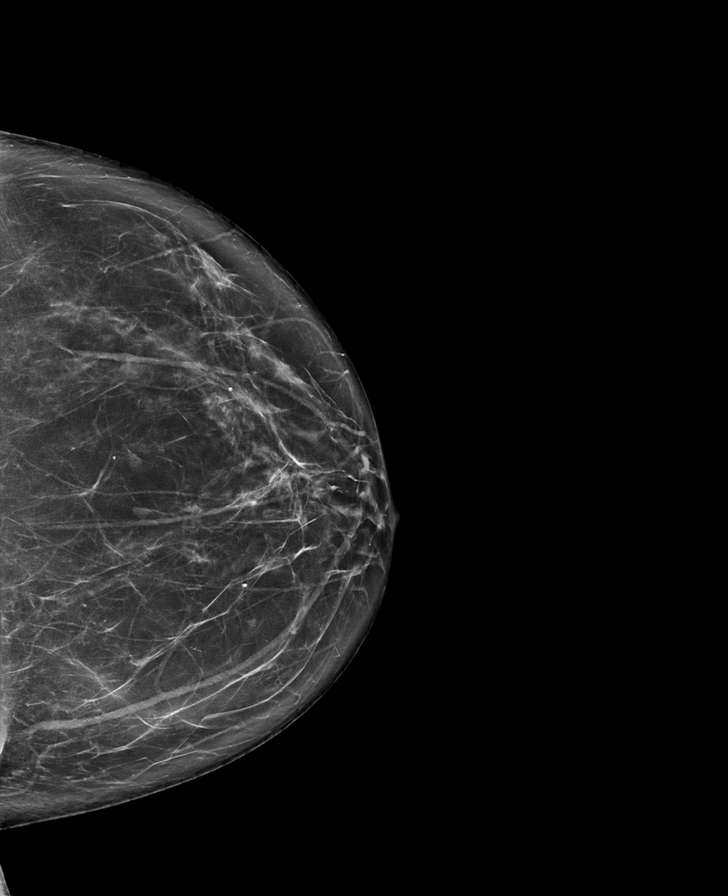

[R MLO tomo · tomo slice 45/90.0]
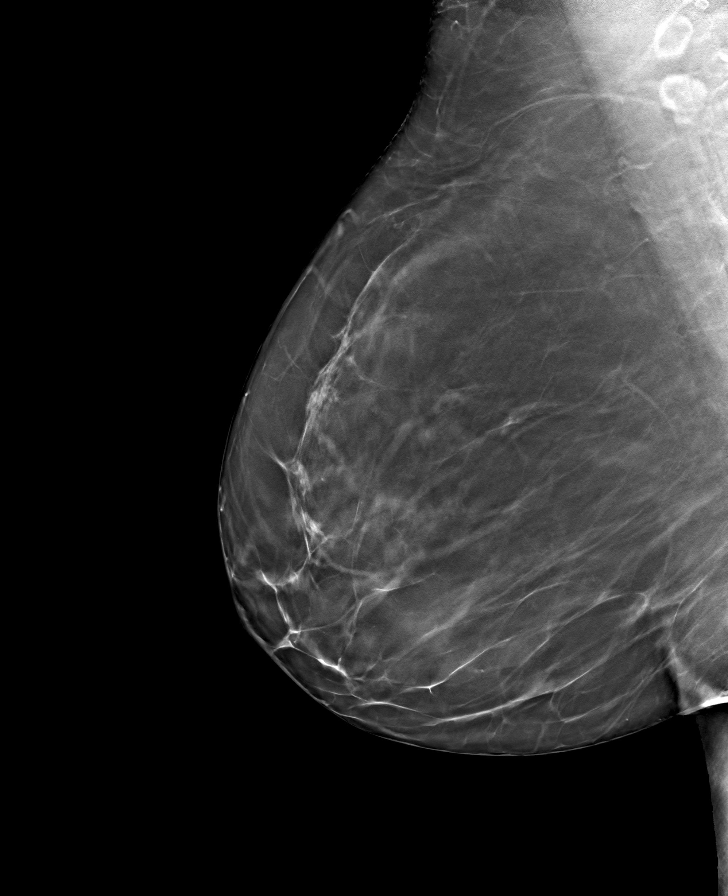

[L MLO tomo · tomo slice 45/88.0]
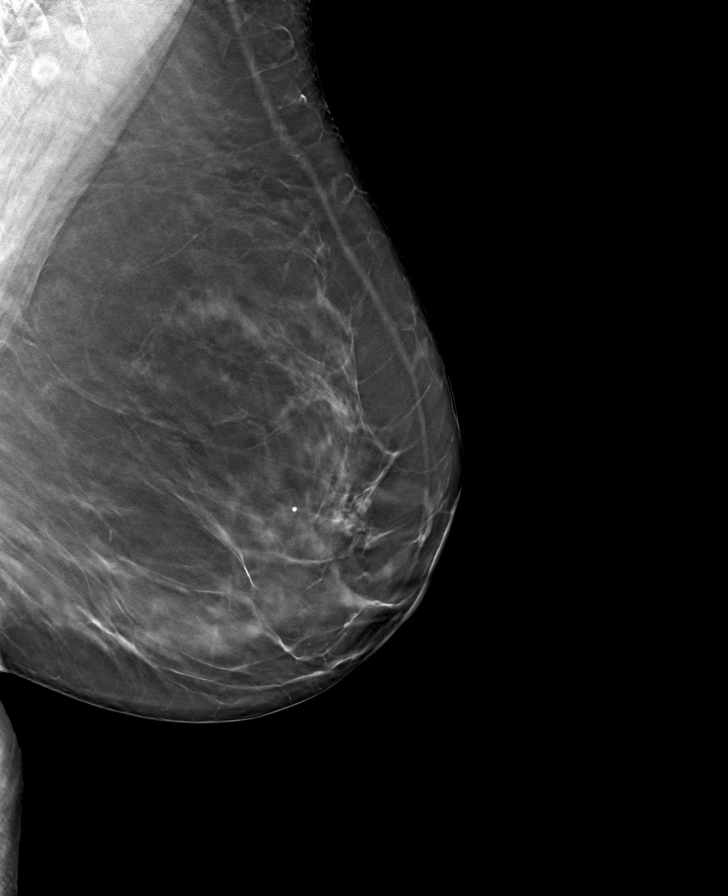

[R CC tomo · tomo slice 41/82.0]
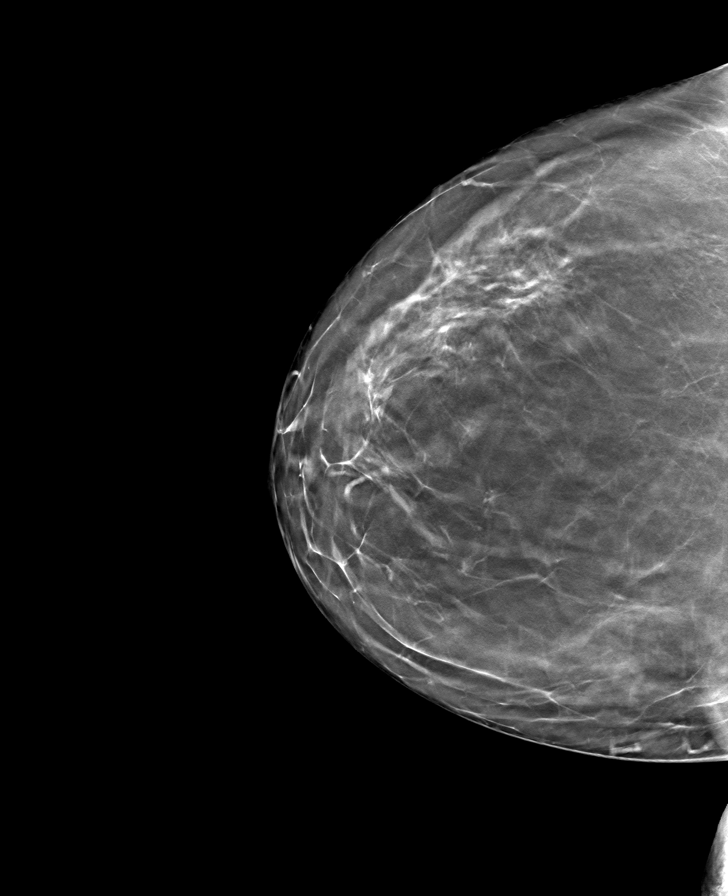

[L CC tomo · tomo slice 41/80.0]
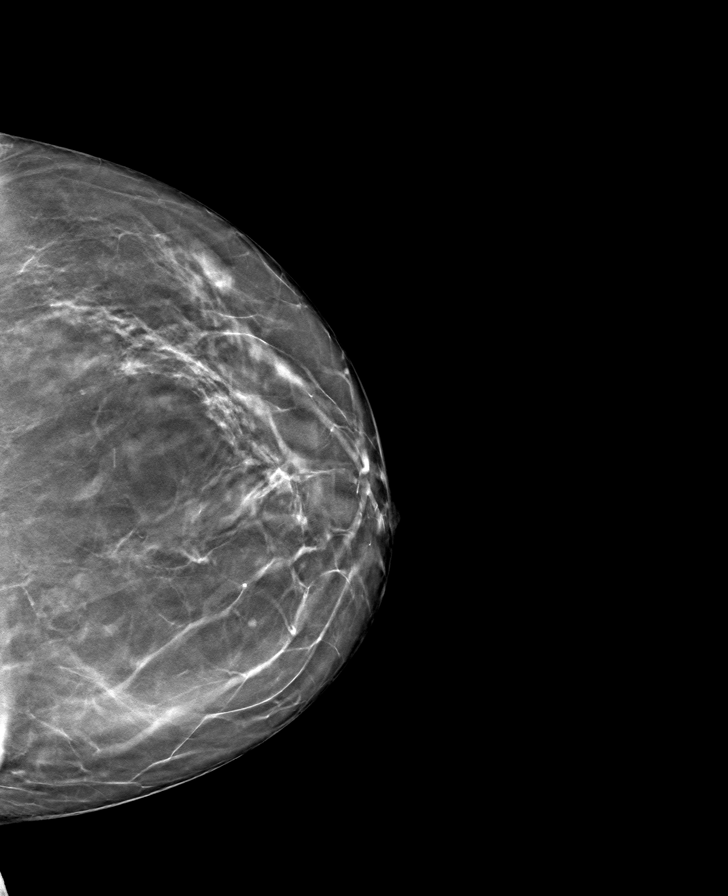

[8 of 24 positions shown; findings below may reference images not displayed]

ACR Breast Density Category b: There are scattered areas of
fibroglandular density.
FINDINGS: There are no findings suspicious for malignancy.
IMPRESSION: No mammographic evidence of malignancy. A result letter of this
screening mammogram will be mailed directly to the patient.

RECOMMENDATION:
Screening mammogram in one year. (Code:51-O-LD2)

BI-RADS CATEGORY  1: Negative.

## 2022-01-02 ENCOUNTER — Encounter: Payer: Self-pay | Admitting: Orthopaedic Surgery

## 2022-01-02 ENCOUNTER — Ambulatory Visit (INDEPENDENT_AMBULATORY_CARE_PROVIDER_SITE_OTHER): Payer: BC Managed Care – PPO | Admitting: Orthopaedic Surgery

## 2022-01-02 ENCOUNTER — Other Ambulatory Visit: Payer: Self-pay | Admitting: Physician Assistant

## 2022-01-02 DIAGNOSIS — S93402A Sprain of unspecified ligament of left ankle, initial encounter: Secondary | ICD-10-CM | POA: Diagnosis not present

## 2022-01-02 DIAGNOSIS — S93402D Sprain of unspecified ligament of left ankle, subsequent encounter: Secondary | ICD-10-CM

## 2022-01-02 NOTE — Progress Notes (Signed)
Office Visit Note   Patient: Tara Kennedy           Date of Birth: 1960/06/22           MRN: [id number] Visit Date: 01/02/2022              Requested by: Aretta Nip, [place],  [place] [address] PCP: Aretta Nip, MD   Assessment & Plan: Visit Diagnoses:  1. Sprain of left ankle, unspecified ligament, initial encounter   2. Sprain of left ankle, unspecified ligament, subsequent encounter     Plan: Several months status post injury left ankle with a sprain pattern.  She was placed in a ankle support which she has been wearing routinely but still has discomfort both medially and laterally.  She has had a prior fusion of the metatarsal phalangeal joint of the great toe performed by Dr. Sharol Given she thinks that may have altered her gait.  She also has significant pronation.  With her persistent ankle pain I am going to order an MRI scan.  She could have some pathology of the flexor tendons behind the medial malleolus.  I am also going to apply a full arch supports for her pronation.  Plan to check her back after the MRI scan.  All questions were answered  Follow-Up Instructions: Return After MRI scan left ankle.   Orders:  Orders Placed This Encounter  Procedures   MR Ankle Left w/o contrast   No orders of the defined types were placed in this encounter.     Procedures: No procedures performed   Clinical Data: No additional findings.   Subjective: Chief Complaint  Patient presents with   Left Foot - Follow-up   Patient presents today for follow up of her left foot pain. Patient states that she was provided a ankle brace however states that she has discontinued around a few weeks ago because it was complicated to fit over her shoe. Instead of wearing her ankle brace she has been wearing a ankle sleeve which has been helping. States that she has noticed increased swelling, sharp, aching, and throbbing pains. She has been elevating  however has not been applying ice. OTC motrin and tylenol is being used to help manage pain.  Review of Systems   Objective: Vital Signs: There were no vitals taken for this visit.  Physical Exam Constitutional:      Appearance: She is well-developed.  Pulmonary:     Effort: Pulmonary effort is normal.  Skin:    General: Skin is warm and dry.  Neurological:     Mental Status: She is alert and oriented to person, place, and time.  Psychiatric:        Behavior: Behavior normal.     Ortho Exam awake alert and oriented x3.  Comfortable sitting left ankle with some tenderness along the flexor tendons behind the medial malleolus.  No edema or erythema.  Seems to have active flexion and inversion.  Minimal pain over the medial malleolus.  Diffuse pain across the anterior aspect of her ankle.  Some pain over the anterior talofibular ligament.  I thought she had increased talar tilt on the left but similar to to the right.  She definitely pronates.  Good fusion across the metatarsal phalangeal joint great toe.  No pain behind the lateral malleolus.  Skin otherwise intact.  No Achilles pain  Specialty Comments:  No specialty comments available.  Imaging: No results found.   PMFS History:  Patient Active Problem List   Diagnosis Date Noted   Left ankle sprain 10/23/2021   Pain in left knee 08/01/2021   Hallux varus (acquired), left foot    Presence of retained hardware    Plantar callus 05/18/2020   Pain in left foot 09/23/2019   Chronic mastoiditis of left side 04/08/2019   Otitis media follow-up, not resolved, left 03/11/2019   Absence of mandibular condyle 03/08/2019   Acquired facial deformity 03/08/2019   CSF otorrhea 03/08/2019   Eustachian tube dysfunction, bilateral 03/08/2019   Other specified postprocedural states 03/08/2019   Pituitary adenoma ([place]) 03/26/2017   Sensorineural hearing loss (SNHL) of right ear with restricted hearing of left ear 03/24/2017   Trismus  03/24/2017   Hypertensive retinopathy of both eyes 04/25/2015   Nuclear sclerotic cataract of both eyes 04/25/2015   Benign meningioma of brain ([place]) 12/09/2014   Primary open angle glaucoma of both eyes, mild stage 02/26/2013   Meningioma ([place]) 03/04/2012   Glaucoma 03/04/2012   HTN (hypertension), benign 03/04/2012   Hyperlipidemia 03/04/2012   Retinal vein thrombosis, left 02/20/2012   Blepharospasm 01/01/2012   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 01/01/2012   Past Medical History:  Diagnosis Date   Asthma    Complication of anesthesia    patient has difficulty opening mouth wide   Glaucoma 03/04/2012   HOH (hard of hearing)    uses hearing aids   HTN (hypertension), benign 03/04/2012   Hyperlipidemia 03/04/2012   Meningioma ([place]) 03/04/2012   Dx 7/10; major resection at [facility], Dr Ernie Avena then [place]  Dr Leonel Ramsay   Retinal vein thrombosis, left 02/20/2012    Family History  Problem Relation Age of Onset   Breast cancer Mother    Breast cancer Sister    Breast cancer Maternal Aunt     Past Surgical History:  Procedure Laterality Date   ARTHRODESIS METATARSALPHALANGEAL JOINT (MTPJ) Left 05/22/2021   Procedure: LEFT GREAT TOE METATARSALPHALANGEAL JOINT (MTPJ) FUSION;  Surgeon: [name], MD;  Location: [facility];  Service: Orthopedics;  Laterality: Left;   COLONOSCOPY WITH PROPOFOL N/A 05/07/2018   Procedure: COLONOSCOPY WITH PROPOFOL;  Surgeon: Ronald Lobo, MD;  Location: [facility];  Service: Endoscopy;  Laterality: N/A;   HEMANGIOMA EXCISION  2012   At [facility]-Major resection   MANDIBLE SURGERY Bilateral    Masseter muscle   Social History   Occupational History   Not on file  Tobacco Use   Smoking status: Never   Smokeless tobacco: Never  Substance and Sexual Activity   Alcohol use: Yes    Alcohol/week: 3.0 standard drinks of alcohol    Types: 3 Glasses of wine per week    Comment: social   Drug use: Never   Sexual activity: Not on  file

## 2022-01-08 ENCOUNTER — Telehealth: Payer: Self-pay | Admitting: Physician Assistant

## 2022-01-08 NOTE — Telephone Encounter (Signed)
Patient husband called in about patient ankle and her brace that is on it wanted to speak with Bevely Palmer before coming in to see Durward Fortes please advise husbands cell [phone]

## 2022-01-18 ENCOUNTER — Ambulatory Visit
Admission: RE | Admit: 2022-01-18 | Discharge: 2022-01-18 | Disposition: A | Discharge status: Home / Self Care | Payer: BC Managed Care – PPO | Source: Ambulatory Visit | Attending: Physician Assistant | Admitting: Physician Assistant

## 2022-01-18 DIAGNOSIS — S93402A Sprain of unspecified ligament of left ankle, initial encounter: Secondary | ICD-10-CM

## 2022-01-19 ENCOUNTER — Other Ambulatory Visit: Payer: BC Managed Care – PPO

## 2022-01-23 ENCOUNTER — Ambulatory Visit: Payer: BC Managed Care – PPO | Admitting: Orthopaedic Surgery

## 2022-01-31 ENCOUNTER — Ambulatory Visit: Payer: BC Managed Care – PPO | Admitting: Orthopaedic Surgery

## 2022-02-01 ENCOUNTER — Other Ambulatory Visit: Payer: Self-pay | Admitting: Obstetrics and Gynecology

## 2022-02-01 DIAGNOSIS — Z1231 Encounter for screening mammogram for malignant neoplasm of breast: Secondary | ICD-10-CM

## 2022-02-05 ENCOUNTER — Encounter: Payer: Self-pay | Admitting: Orthopaedic Surgery

## 2022-02-05 ENCOUNTER — Ambulatory Visit (INDEPENDENT_AMBULATORY_CARE_PROVIDER_SITE_OTHER): Payer: BC Managed Care – PPO | Admitting: Orthopaedic Surgery

## 2022-02-05 DIAGNOSIS — S93402D Sprain of unspecified ligament of left ankle, subsequent encounter: Secondary | ICD-10-CM | POA: Diagnosis not present

## 2022-02-05 NOTE — Progress Notes (Signed)
Office Visit Note   Patient: Tara Kennedy           Date of Birth: 1960/08/30           MRN: [id number] Visit Date: 02/05/2022              Requested by: Aretta Nip, [place],  [place] [address] PCP: Aretta Nip, MD   Assessment & Plan: Visit Diagnoses:  1. Sprain of left ankle, unspecified ligament, subsequent encounter     Plan: MRI scan of the left ankle demonstrated mild tendinosis of the peroneus brevis and longus.  There was very minimal peroneal tenosynovitis.  Laterally there was thickening of the anterior talofibular ligament consistent with a ligament strain and partial thickness tear.  Calcaneofibular ligament was intact.  Posterior talofibular ligament was intact.  Deltoid ligament was intact.  There was a small ankle joint effusion but no chondral defect and normal ankle mortise.  No aggressive osseous lesions.  Mild osteoarthritis of the second tarsometatarsal joint.  Muscles were normal without edema or atrophy.  The tarsal tunnel was normal.  Severe soft tissue edema circumferentially about the ankle.  I believe that was related to with the straps from her shoe and ankle support.  Given all of the above I think it is worth just having her wear comfortable shoes with the arch supports.  Some of the problem may be related to wearing the brace.  Discussed the MRI scan in detail.  No need to consider surgery or therapy at this point.  I think over time she will continue to improve.  Some of the mechanics of her gait may have been changed because of her prior surgery to the hammertoe of second toe and fusion of the great toe metatarsal phalangeal joint.  Discussed all the above and answered all questions  Follow-Up Instructions: Return if symptoms worsen or fail to improve.   Orders:  No orders of the defined types were placed in this encounter.  No orders of the defined types were placed in this encounter.     Procedures: No  procedures performed   Clinical Data: No additional findings.   Subjective: Chief Complaint  Patient presents with   Left Ankle - Follow-up   Patient presents today to discuss her MRI results of her left ankle. Patient states that she has continued to wear her post op shoe on the left ankle as instructed. She has had no recent injuries or falls that she would like to report. At this time no medications are being used for pain.   Review of Systems   Objective: Vital Signs: There were no vitals taken for this visit.  Physical Exam Constitutional:      Appearance: She is well-developed.  Eyes:     Pupils: Pupils are equal, round, and reactive to light.  Pulmonary:     Effort: Pulmonary effort is normal.  Skin:    General: Skin is warm and dry.  Neurological:     Mental Status: She is alert and oriented to person, place, and time.  Psychiatric:        Behavior: Behavior normal.     Ortho Exam awake alert and oriented x3.  Comfortable sitting.  Left foot with prior surgery for the great toe metatarsal phalangeal joint and even more remotely a dorsal incision of the second toe for hammering.  Presently having very minimal discomfort.  There is little bit of swelling between the straps across  the ankle and the midfoot.  There is no redness.  Neurologically intact.  Very minimal tenderness behind the medial lateral malleolar eye.  The prior pain over the medial malleolus has resolved.  There is no evidence of instability with either the deltoid or the anterior talofibular ligament.  Good capillary refill to the toes.  Has a little varus position of the the great toe and even of the midfoot  Specialty Comments:  No specialty comments available.  Imaging: No results found.   PMFS History: Patient Active Problem List   Diagnosis Date Noted   Left ankle sprain 10/23/2021   Pain in left knee 08/01/2021   Hallux varus (acquired), left foot    Presence of retained hardware     Plantar callus 05/18/2020   Pain in left foot 09/23/2019   Chronic mastoiditis of left side 04/08/2019   Otitis media follow-up, not resolved, left 03/11/2019   Absence of mandibular condyle 03/08/2019   Acquired facial deformity 03/08/2019   CSF otorrhea 03/08/2019   Eustachian tube dysfunction, bilateral 03/08/2019   Other specified postprocedural states 03/08/2019   Pituitary adenoma ([place]) 03/26/2017   Sensorineural hearing loss (SNHL) of right ear with restricted hearing of left ear 03/24/2017   Trismus 03/24/2017   Hypertensive retinopathy of both eyes 04/25/2015   Nuclear sclerotic cataract of both eyes 04/25/2015   Benign meningioma of brain ([place]) 12/09/2014   Primary open angle glaucoma of both eyes, mild stage 02/26/2013   Meningioma ([place]) 03/04/2012   Glaucoma 03/04/2012   HTN (hypertension), benign 03/04/2012   Hyperlipidemia 03/04/2012   Retinal vein thrombosis, left 02/20/2012   Blepharospasm 01/01/2012   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 01/01/2012   Past Medical History:  Diagnosis Date   Asthma    Complication of anesthesia    patient has difficulty opening mouth wide   Glaucoma 03/04/2012   HOH (hard of hearing)    uses hearing aids   HTN (hypertension), benign 03/04/2012   Hyperlipidemia 03/04/2012   Meningioma ([place]) 03/04/2012   Dx 7/10; major resection at [place], Dr Ernie Avena then [place]  Dr Leonel Ramsay   Retinal vein thrombosis, left 02/20/2012    Family History  Problem Relation Age of Onset   Breast cancer Mother    Breast cancer Sister    Breast cancer Maternal Aunt     Past Surgical History:  Procedure Laterality Date   ARTHRODESIS METATARSALPHALANGEAL JOINT (MTPJ) Left 05/22/2021   Procedure: LEFT GREAT TOE METATARSALPHALANGEAL JOINT (MTPJ) FUSION;  Surgeon: [name], MD;  Location: [facility];  Service: Orthopedics;  Laterality: Left;   COLONOSCOPY WITH PROPOFOL N/A 05/07/2018   Procedure: COLONOSCOPY WITH PROPOFOL;  Surgeon:  Ronald Lobo, MD;  [facility];  Service: Endoscopy;  Laterality: N/A;   HEMANGIOMA EXCISION  2012   At [facility]-Major resection   MANDIBLE SURGERY Bilateral    Masseter muscle   Social History   Occupational History   Not on file  Tobacco Use   Smoking status: Never   Smokeless tobacco: Never  Substance and Sexual Activity   Alcohol use: Yes    Alcohol/week: 3.0 standard drinks of alcohol    Types: 3 Glasses of wine per week    Comment: social   Drug use: Never   Sexual activity: Not on file

## 2022-03-04 ENCOUNTER — Ambulatory Visit: Payer: BC Managed Care – PPO

## 2022-03-25 ENCOUNTER — Ambulatory Visit
Admission: RE | Admit: 2022-03-25 | Discharge: 2022-03-25 | Disposition: A | Discharge status: Home / Self Care | Payer: BC Managed Care – PPO | Source: Ambulatory Visit | Attending: Obstetrics and Gynecology | Admitting: Obstetrics and Gynecology

## 2022-03-25 DIAGNOSIS — Z1231 Encounter for screening mammogram for malignant neoplasm of breast: Secondary | ICD-10-CM

## 2023-02-24 ENCOUNTER — Other Ambulatory Visit: Payer: Self-pay | Admitting: Family Medicine

## 2023-02-24 DIAGNOSIS — Z1231 Encounter for screening mammogram for malignant neoplasm of breast: Secondary | ICD-10-CM

## 2023-03-28 ENCOUNTER — Ambulatory Visit
Admission: RE | Admit: 2023-03-28 | Discharge: 2023-03-28 | Disposition: A | Discharge status: Home / Self Care | Payer: BC Managed Care – PPO | Source: Ambulatory Visit | Attending: Family Medicine | Admitting: Family Medicine

## 2023-03-28 DIAGNOSIS — Z1231 Encounter for screening mammogram for malignant neoplasm of breast: Secondary | ICD-10-CM

## 2023-04-03 ENCOUNTER — Other Ambulatory Visit: Payer: Self-pay | Admitting: Medical Genetics

## 2023-04-03 DIAGNOSIS — Z006 Encounter for examination for normal comparison and control in clinical research program: Secondary | ICD-10-CM

## 2023-04-08 ENCOUNTER — Other Ambulatory Visit (HOSPITAL_COMMUNITY)
Admission: RE | Admit: 2023-04-08 | Discharge: 2023-04-08 | Disposition: A | Discharge status: Home / Self Care | Payer: BC Managed Care – PPO | Source: Ambulatory Visit | Attending: Oncology | Admitting: Oncology

## 2023-04-08 DIAGNOSIS — Z006 Encounter for examination for normal comparison and control in clinical research program: Secondary | ICD-10-CM | POA: Insufficient documentation

## 2023-04-22 LAB — GENECONNECT MOLECULAR SCREEN

## 2023-04-22 LAB — HELIX MOLECULAR SCREEN: Genetic Analysis Overall Interpretation: NEGATIVE

## 2024-02-12 ENCOUNTER — Other Ambulatory Visit: Payer: Self-pay | Admitting: Obstetrics and Gynecology

## 2024-02-12 DIAGNOSIS — Z1231 Encounter for screening mammogram for malignant neoplasm of breast: Secondary | ICD-10-CM

## 2024-03-30 ENCOUNTER — Ambulatory Visit
Admission: RE | Admit: 2024-03-30 | Discharge: 2024-03-30 | Disposition: A | Discharge status: Home / Self Care | Source: Ambulatory Visit | Attending: Obstetrics and Gynecology | Admitting: Obstetrics and Gynecology

## 2024-03-30 DIAGNOSIS — Z1231 Encounter for screening mammogram for malignant neoplasm of breast: Secondary | ICD-10-CM
